# Patient Record
Sex: Male | Born: 1950 | Hispanic: Yes | Smoking: Never smoker
Health system: Southern US, Community
[De-identification: ages and names within clinical notes are randomized; demographics above are authoritative.]

## PROBLEM LIST (undated history)

## (undated) ENCOUNTER — Ambulatory Visit (HOSPITAL_COMMUNITY): Admission: EM | Payer: BC Managed Care – PPO

## (undated) DIAGNOSIS — J45909 Unspecified asthma, uncomplicated: Secondary | ICD-10-CM

## (undated) DIAGNOSIS — J309 Allergic rhinitis, unspecified: Secondary | ICD-10-CM

## (undated) HISTORY — DX: Allergic rhinitis, unspecified: J30.9

## (undated) HISTORY — DX: Unspecified asthma, uncomplicated: J45.909

## (undated) HISTORY — PX: INGUINAL HERNIA REPAIR: SUR1180

---

## 2014-01-07 ENCOUNTER — Other Ambulatory Visit: Payer: Self-pay | Admitting: Infectious Disease

## 2014-01-07 ENCOUNTER — Ambulatory Visit
Admission: RE | Admit: 2014-01-07 | Discharge: 2014-01-07 | Disposition: A | Discharge status: Home / Self Care | Payer: No Typology Code available for payment source | Source: Ambulatory Visit | Attending: Infectious Disease | Admitting: Infectious Disease

## 2014-01-07 DIAGNOSIS — R7611 Nonspecific reaction to tuberculin skin test without active tuberculosis: Secondary | ICD-10-CM

## 2014-08-14 ENCOUNTER — Ambulatory Visit: Payer: Self-pay | Attending: Internal Medicine

## 2015-06-02 ENCOUNTER — Ambulatory Visit: Payer: Self-pay | Admitting: Family Medicine

## 2015-07-22 ENCOUNTER — Ambulatory Visit: Payer: Self-pay | Admitting: Family Medicine

## 2015-08-07 ENCOUNTER — Encounter: Payer: Self-pay | Admitting: Family Medicine

## 2015-08-07 ENCOUNTER — Ambulatory Visit (INDEPENDENT_AMBULATORY_CARE_PROVIDER_SITE_OTHER): Payer: BLUE CROSS/BLUE SHIELD | Admitting: Family Medicine

## 2015-08-07 VITALS — BP 142/88 | HR 68 | Temp 98.0°F | Ht 65.0 in | Wt 158.8 lb

## 2015-08-07 DIAGNOSIS — R7611 Nonspecific reaction to tuberculin skin test without active tuberculosis: Secondary | ICD-10-CM

## 2015-08-07 DIAGNOSIS — M25522 Pain in left elbow: Secondary | ICD-10-CM | POA: Diagnosis not present

## 2015-08-07 DIAGNOSIS — B9789 Other viral agents as the cause of diseases classified elsewhere: Principal | ICD-10-CM

## 2015-08-07 DIAGNOSIS — J069 Acute upper respiratory infection, unspecified: Secondary | ICD-10-CM | POA: Diagnosis not present

## 2015-08-07 NOTE — Assessment & Plan Note (Addendum)
Anticipate viral given short duration and no signs of bacterial infection today. See pt instructions for supportive care - ibuprofen and delsym prn, and given h/o asthma will refill albuterol inhaler. Update if not improving as expected.

## 2015-08-07 NOTE — Assessment & Plan Note (Addendum)
Not quite consistent with tennis elbow or tendonitis - but will regardless treat as such with NSAID and exercises from SM pt advisor. Take ibuprofen 400mg  bid x 5d with meals then prn

## 2015-08-07 NOTE — Progress Notes (Signed)
Pre visit review using our clinic review tool, if applicable. No additional management support is needed unless otherwise documented below in the visit note. 

## 2015-08-07 NOTE — Assessment & Plan Note (Signed)
Update PPD next physical.

## 2015-08-07 NOTE — Patient Instructions (Addendum)
Call your insurance about the shingles shot to see if it is covered or how much it would cost and where is cheaper (here or pharmacy).  If you want to receive here, call for nurse visit.   Para posible tendonitis del codo izquierdo - empieze ibuprofeno 400mg  dos veces al dia con comida x5 dias y luego solo cuando necesite. Haga ejercicios recetados hoy. Creo tambien que tiene infeccion viral respiratoria - debe mejorar con Museum/gallery conservatorel tiempo. Tome delsym y albuterol como necesite.   Proxima visita - fisico con laboratorios en los proximos 2 meses.  Gusto conocerlo hoy, llamenos con preguntas.

## 2015-08-07 NOTE — Progress Notes (Addendum)
BP 142/88 mmHg  Pulse 68  Temp(Src) 98 F (36.7 C) (Oral)  Ht  (1.651 m)  Wt 158 lb 12 oz (72.009 kg)  BMI 26.42 kg/m2   CC: new pt to establish  Subjective:    Patient ID: Melvin Castillo, male    DOB: 07-Jun-1950, 65 y.o.   MRN: [id number]  HPI: Melvin Castillo is a 65 y.o. 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  Wife passed away from pancreatic cancer 05/2015.  Has supportive family in area and supportive church family.  Lactose intolerance.   Ongoing URI 2 wks - started with fever, ST, now with rhinorrhea, mildly productive cough. Some sinus and chest congestion. Self treating with loratadine and proair inhaler. Has been told has propensity to asthma. No sick contacts at home.   L elbow pain ongoing for last few months. Points to lateral L elbow. affecting left hand grip strength. Slowly improving. Denies inciting trauma/injury.  Takes several usana supplements - glucosamine.   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 Will recheck next visit. Declines unexplained night sweats or prolonged cough. Denies known exposure.   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 Interim medical history since our last visit reviewed. Allergies and medications reviewed and updated. No current outpatient prescriptions on file prior to visit.   No current facility-administered medications on file prior to visit.    Review of Systems Per HPI unless specifically indicated in ROS section     Objective:    BP 142/88 mmHg  Pulse 68  Temp(Src) 98 F (36.7  C) (Oral)  Ht  (1.651 m)  Wt 158 lb 12 oz (72.009 kg)  BMI 26.42 kg/m2  Wt Readings from Last 3 Encounters:  08/07/15 158 lb 12 oz (72.009 kg)    Physical Exam  Constitutional: He appears well-developed and well-nourished. No distress.  HENT:  Head: Normocephalic and atraumatic.  Right Ear: Hearing, tympanic membrane, external ear and ear canal normal.  Left Ear: Hearing, tympanic membrane, external ear and ear canal normal.  Nose: Nose normal. No mucosal edema or rhinorrhea. Right sinus exhibits no maxillary sinus tenderness and no frontal sinus tenderness. Left sinus exhibits no maxillary sinus tenderness and no frontal sinus tenderness.  Mouth/Throat: Uvula is midline, oropharynx is clear and moist and mucous membranes are normal. No oropharyngeal exudate, posterior oropharyngeal edema, posterior oropharyngeal erythema or tonsillar abscesses.  Eyes: Conjunctivae and EOM are normal. Pupils are equal, round, and reactive to light. No scleral icterus.  Neck: Normal range of motion. Neck supple.  Small LN R posterior cervical chain  Cardiovascular: Normal rate, regular rhythm, normal heart sounds and intact distal pulses.   No murmur heard. Pulmonary/Chest: Effort normal and breath sounds normal. No respiratory distress. He has no wheezes. He has no rales.  Musculoskeletal: He exhibits no edema.  FROM at elbow Tender to palpation just proximal to lateral epicondyle.  Lymphadenopathy:    He has no cervical adenopathy.  Skin: Skin is warm and dry. No rash noted.  Nursing note and vitals reviewed.  No results found for this or any previous  visit.    Assessment & Plan:   Problem List Items Addressed This Visit    Viral URI with cough - Primary    Anticipate viral given short duration and no signs of bacterial infection today. See pt instructions for supportive care - ibuprofen and delsym prn, and given h/o asthma will refill albuterol inhaler. Update if not improving as  expected.      Positive PPD    Update PPD next physical.      Left elbow pain    Not quite consistent with tennis elbow or tendonitis - but will regardless treat as such with NSAID and exercises from SM pt advisor. Take ibuprofen 400mg  bid x 5d with meals then prn          Follow up plan: Return in about 2 months (around 10/07/2015) for annual exam, prior fasting for blood work.

## 2015-09-13 ENCOUNTER — Encounter: Payer: Self-pay | Admitting: Family Medicine

## 2015-09-28 ENCOUNTER — Other Ambulatory Visit: Payer: Self-pay | Admitting: Family Medicine

## 2015-09-28 DIAGNOSIS — R7611 Nonspecific reaction to tuberculin skin test without active tuberculosis: Secondary | ICD-10-CM

## 2015-09-28 DIAGNOSIS — Z125 Encounter for screening for malignant neoplasm of prostate: Secondary | ICD-10-CM

## 2015-09-28 DIAGNOSIS — Z806 Family history of leukemia: Secondary | ICD-10-CM

## 2015-09-28 DIAGNOSIS — Z131 Encounter for screening for diabetes mellitus: Secondary | ICD-10-CM

## 2015-09-28 DIAGNOSIS — Z13220 Encounter for screening for lipoid disorders: Secondary | ICD-10-CM

## 2015-09-28 DIAGNOSIS — Z1159 Encounter for screening for other viral diseases: Secondary | ICD-10-CM

## 2015-10-02 ENCOUNTER — Other Ambulatory Visit: Payer: Self-pay | Admitting: Family Medicine

## 2015-10-02 ENCOUNTER — Ambulatory Visit (INDEPENDENT_AMBULATORY_CARE_PROVIDER_SITE_OTHER)
Admission: RE | Admit: 2015-10-02 | Discharge: 2015-10-02 | Disposition: A | Discharge status: Home / Self Care | Payer: BLUE CROSS/BLUE SHIELD | Source: Ambulatory Visit | Attending: Family Medicine | Admitting: Family Medicine

## 2015-10-02 ENCOUNTER — Other Ambulatory Visit (INDEPENDENT_AMBULATORY_CARE_PROVIDER_SITE_OTHER): Payer: BLUE CROSS/BLUE SHIELD

## 2015-10-02 DIAGNOSIS — Z806 Family history of leukemia: Secondary | ICD-10-CM

## 2015-10-02 DIAGNOSIS — Z131 Encounter for screening for diabetes mellitus: Secondary | ICD-10-CM

## 2015-10-02 DIAGNOSIS — Z125 Encounter for screening for malignant neoplasm of prostate: Secondary | ICD-10-CM

## 2015-10-02 DIAGNOSIS — R7611 Nonspecific reaction to tuberculin skin test without active tuberculosis: Secondary | ICD-10-CM

## 2015-10-02 DIAGNOSIS — Z13220 Encounter for screening for lipoid disorders: Secondary | ICD-10-CM | POA: Diagnosis not present

## 2015-10-02 DIAGNOSIS — Z1159 Encounter for screening for other viral diseases: Secondary | ICD-10-CM

## 2015-10-02 LAB — PSA: PSA: 1.76 ng/mL (ref 0.10–4.00)

## 2015-10-02 LAB — CBC WITH DIFFERENTIAL/PLATELET
BASOS ABS: 0 10*3/uL (ref 0.0–0.1)
Basophils Relative: 0.4 % (ref 0.0–3.0)
EOS ABS: 0.2 10*3/uL (ref 0.0–0.7)
Eosinophils Relative: 3.9 % (ref 0.0–5.0)
HEMATOCRIT: 43.1 % (ref 39.0–52.0)
HEMOGLOBIN: 14.5 g/dL (ref 13.0–17.0)
LYMPHS ABS: 1.9 10*3/uL (ref 0.7–4.0)
LYMPHS PCT: 40 % (ref 12.0–46.0)
MCHC: 33.7 g/dL (ref 30.0–36.0)
MCV: 87.8 fl (ref 78.0–100.0)
MONOS PCT: 8.3 % (ref 3.0–12.0)
Monocytes Absolute: 0.4 10*3/uL (ref 0.1–1.0)
Neutro Abs: 2.3 10*3/uL (ref 1.4–7.7)
Neutrophils Relative %: 47.4 % (ref 43.0–77.0)
Platelets: 184 10*3/uL (ref 150.0–400.0)
RBC: 4.92 Mil/uL (ref 4.22–5.81)
RDW: 14.9 % (ref 11.5–15.5)
WBC: 4.8 10*3/uL (ref 4.0–10.5)

## 2015-10-02 LAB — LIPID PANEL
CHOL/HDL RATIO: 3
Cholesterol: 142 mg/dL (ref 0–200)
HDL: 41.6 mg/dL (ref >39.00)
LDL CALC: 86 mg/dL (ref 0–99)
NONHDL: 100.48
Triglycerides: 74 mg/dL (ref 0.0–149.0)
VLDL: 14.8 mg/dL (ref 0.0–40.0)

## 2015-10-02 LAB — BASIC METABOLIC PANEL
BUN: 14 mg/dL (ref 6–23)
CO2: 27 mEq/L (ref 19–32)
Calcium: 9.3 mg/dL (ref 8.4–10.5)
Chloride: 104 mEq/L (ref 96–112)
Creatinine, Ser: 0.98 mg/dL (ref 0.40–1.50)
GFR: 81.96 mL/min (ref >60.00)
Glucose, Bld: 96 mg/dL (ref 70–99)
POTASSIUM: 4.5 meq/L (ref 3.5–5.1)
SODIUM: 139 meq/L (ref 135–145)

## 2015-10-03 LAB — HEPATITIS C ANTIBODY: HCV Ab: NEGATIVE

## 2015-10-09 ENCOUNTER — Encounter: Payer: BLUE CROSS/BLUE SHIELD | Admitting: Family Medicine

## 2015-10-27 ENCOUNTER — Telehealth: Payer: Self-pay | Admitting: Family Medicine

## 2015-10-27 ENCOUNTER — Encounter: Payer: BLUE CROSS/BLUE SHIELD | Admitting: Family Medicine

## 2015-10-27 DIAGNOSIS — Z0289 Encounter for other administrative examinations: Secondary | ICD-10-CM

## 2015-10-27 NOTE — Telephone Encounter (Signed)
Patient did not come for their scheduled appointment today for cpx  Please let me know if the patient needs to be contacted immediately for follow up or if no follow up is necessary.   ° °

## 2015-10-28 NOTE — Telephone Encounter (Signed)
Pt r/s 7/17

## 2015-10-30 ENCOUNTER — Encounter: Payer: BLUE CROSS/BLUE SHIELD | Admitting: Family Medicine

## 2015-12-08 ENCOUNTER — Encounter: Payer: Self-pay | Admitting: Family Medicine

## 2015-12-08 ENCOUNTER — Ambulatory Visit (INDEPENDENT_AMBULATORY_CARE_PROVIDER_SITE_OTHER): Payer: BC Managed Care – PPO | Admitting: Family Medicine

## 2015-12-08 VITALS — BP 116/68 | HR 62 | Temp 98.0°F | Resp 16 | Ht 65.0 in | Wt 159.0 lb

## 2015-12-08 DIAGNOSIS — R7611 Nonspecific reaction to tuberculin skin test without active tuberculosis: Secondary | ICD-10-CM | POA: Diagnosis not present

## 2015-12-08 DIAGNOSIS — Z1211 Encounter for screening for malignant neoplasm of colon: Secondary | ICD-10-CM

## 2015-12-08 DIAGNOSIS — Z0000 Encounter for general adult medical examination without abnormal findings: Secondary | ICD-10-CM

## 2015-12-08 MED ORDER — ALBUTEROL SULFATE HFA 108 (90 BASE) MCG/ACT IN AERS: 2.0000 | INHALATION_SPRAY | Freq: Four times a day (QID) | RESPIRATORY_TRACT | Status: DC | PRN

## 2015-12-08 NOTE — Progress Notes (Signed)
BP 116/68 mmHg  Pulse 62  Temp(Src) 98 F (36.7 C) (Oral)  Resp 16  Ht  (1.651 m)  Wt 159 lb (72.122 kg)  BMI 26.46 kg/m2  SpO2 98%   CC: CPE  Subjective:    Patient ID: Melvin Castillo, male    DOB: July 23, 1950, 65 y.o.   MRN: [id number]  HPI: Melvin Castillo is a 65 y.o. male presenting on 12/08/2015 for Annual Exam and Neck Pain   CPE today. Wife recently passed away from pancreatic cancer. Supportive family and church.   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 Never completed TB treatment. No h/o TB. Declines unexplained night sweats or prolonged cough. Denies known exposure.   2d h/o R neck pain with radiation to upper back/shoulder. Started when fellow church member started massaging him too hard on back. Today improving on its own. Treated with ibuprofen.   Also with L foot pain - provided with stretching exercises for plantar fasciitis.   Takes usana supplement.   Preventative: Colonoscopy - per pt ~10 yrs ago WNL. Requests referral for colonoscopy.  Prostate cancer - discussed, would like to screen.  Declines flu shot  Tetanus 2014  Pneumovax 2014  zostavax - will check with insurance  Seat belt use discussed No changing moles on skin.   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 Interim medical history since our last visit reviewed. Allergies and medications reviewed and updated. Current Outpatient Prescriptions on File Prior to Visit  Medication Sig  . NON FORMULARY Usana supplements   No current facility-administered medications on file prior to visit.    Review of Systems  Constitutional: Negative for fever, chills, activity change, appetite change, fatigue and unexpected weight change.  HENT: Negative for  hearing loss.   Eyes: Negative for visual disturbance.  Respiratory: Negative for cough, chest tightness, shortness of breath and wheezing.   Cardiovascular: Negative for chest pain, palpitations and leg swelling.  Gastrointestinal: Positive for blood in stool (with wiping). Negative for nausea, vomiting, abdominal pain, diarrhea, constipation and abdominal distention.       H/o hemorrhoids - uses prep H  Genitourinary: Negative for hematuria and difficulty urinating.  Musculoskeletal: Negative for myalgias, arthralgias and neck pain.  Skin: Negative for rash.  Neurological: Negative for dizziness, seizures, syncope and headaches.  Hematological: Negative for adenopathy. Does not bruise/bleed easily.  Psychiatric/Behavioral: Negative for dysphoric mood. The patient is not nervous/anxious.    Per HPI unless specifically indicated in ROS section     Objective:    BP 116/68 mmHg  Pulse 62  Temp(Src) 98 F (36.7 C) (Oral)  Resp 16  Ht  (1.651 m)  Wt 159 lb (72.122 kg)  BMI 26.46 kg/m2  SpO2 98%  Wt Readings from Last 3 Encounters:  12/08/15 159 lb (72.122 kg)  08/07/15 158 lb 12 oz (72.009 kg)    Physical Exam  Constitutional: He is oriented to person, place, and time. He appears well-developed and well-nourished. No distress.  HENT:  Head: Normocephalic and atraumatic.  Right Ear: Hearing, tympanic membrane, external ear and ear canal normal.  Left Ear: Hearing, tympanic membrane, external ear and ear canal normal.  Nose: Nose normal.  Mouth/Throat: Uvula is midline, oropharynx is clear and moist and mucous membranes  are normal. No oropharyngeal exudate, posterior oropharyngeal edema or posterior oropharyngeal erythema.  Eyes: Conjunctivae and EOM are normal. Pupils are equal, round, and reactive to light. No scleral icterus.  Neck: Normal range of motion. Neck supple. Carotid bruit is not present. No thyromegaly present.  Cardiovascular: Normal rate, regular rhythm, normal  heart sounds and intact distal pulses.   No murmur heard. Pulses:      Radial pulses are 2+ on the right side, and 2+ on the left side.  Pulmonary/Chest: Effort normal and breath sounds normal. No respiratory distress. He has no wheezes. He has no rales.  Abdominal: Soft. Bowel sounds are normal. He exhibits no distension and no mass. There is tenderness. There is no rebound and no guarding.  Genitourinary: Rectum normal and prostate normal. Rectal exam shows no external hemorrhoid, no fissure, no mass, no tenderness and anal tone normal. Prostate is not enlarged (15gm) and not tender.  Musculoskeletal: Normal range of motion. He exhibits no edema.  Lymphadenopathy:    He has no cervical adenopathy.  Neurological: He is alert and oriented to person, place, and time.  CN grossly intact, station and gait intact  Skin: Skin is warm and dry. No rash noted.  Psychiatric: He has a normal mood and affect. His behavior is normal. Judgment and thought content normal.  Nursing note and vitals reviewed.  Results for orders placed or performed in visit on 10/02/15  Lipid panel  Result Value Ref Range   Cholesterol 142 0 - 200 mg/dL   Triglycerides 16.174.0 0.0 - 149.0 mg/dL   HDL 09.6041.60 >45.40>39.00 mg/dL   VLDL 98.114.8 0.0 - 19.140.0 mg/dL   LDL Cholesterol 86 0 - 99 mg/dL   Total CHOL/HDL Ratio 3    NonHDL 100.48   Basic metabolic panel  Result Value Ref Range   Sodium 139 135 - 145 mEq/L   Potassium 4.5 3.5 - 5.1 mEq/L   Chloride 104 96 - 112 mEq/L   CO2 27 19 - 32 mEq/L   Glucose, Bld 96 70 - 99 mg/dL   BUN 14 6 - 23 mg/dL   Creatinine, Ser 4.780.98 0.40 - 1.50 mg/dL   Calcium 9.3 8.4 - 29.510.5 mg/dL   GFR 62.1381.96 >08.65>60.00 mL/min  PSA  Result Value Ref Range   PSA 1.76 0.10 - 4.00 ng/mL  CBC with Differential/Platelet  Result Value Ref Range   WBC 4.8 4.0 - 10.5 K/uL   RBC 4.92 4.22 - 5.81 Mil/uL   Hemoglobin 14.5 13.0 - 17.0 g/dL   HCT 78.443.1 69.639.0 - 29.552.0 %   MCV 87.8 78.0 - 100.0 fl   MCHC 33.7 30.0 - 36.0  g/dL   RDW 28.414.9 13.211.5 - 44.015.5 %   Platelets 184.0 150.0 - 400.0 K/uL   Neutrophils Relative % 47.4 43.0 - 77.0 %   Lymphocytes Relative 40.0 12.0 - 46.0 %   Monocytes Relative 8.3 3.0 - 12.0 %   Eosinophils Relative 3.9 0.0 - 5.0 %   Basophils Relative 0.4 0.0 - 3.0 %   Neutro Abs 2.3 1.4 - 7.7 K/uL   Lymphs Abs 1.9 0.7 - 4.0 K/uL   Monocytes Absolute 0.4 0.1 - 1.0 K/uL   Eosinophils Absolute 0.2 0.0 - 0.7 K/uL   Basophils Absolute 0.0 0.0 - 0.1 K/uL      Assessment & Plan:   Problem List Items Addressed This Visit    Health maintenance examination - Primary    Preventative protocols reviewed and updated unless pt declined. Discussed  healthy diet and lifestyle.       Positive PPD    Normal CXR 12/2013, 09/2015. Never completed treatment. Check quantiferon gold. If positive, will need TB treatment.       Relevant Orders   Quantiferon tb gold assay    Other Visit Diagnoses    Special screening for malignant neoplasms, colon        Relevant Orders    Ambulatory referral to Gastroenterology        Follow up plan: Return in about 1 year (around 12/07/2016), or as needed, for annual exam, prior fasting for blood work.  Eustaquio Boyden, MD

## 2015-12-08 NOTE — Assessment & Plan Note (Signed)
Preventative protocols reviewed and updated unless pt declined. Discussed healthy diet and lifestyle.  

## 2015-12-08 NOTE — Assessment & Plan Note (Addendum)
Normal CXR 12/2013, 09/2015. Never completed treatment. Check quantiferon gold. If positive, will need TB treatment.

## 2015-12-08 NOTE — Patient Instructions (Addendum)
Call your insurance about the shingles shot to see if it is covered or how much it would cost and where is cheaper (here or pharmacy).  If you want to receive here, call for nurse visit.  Lo vamos a remitir para colonoscopia con doctores [place].  Revisaremos preuba de sangre para ver riesgos de turbeculosis.  Regresar en 1 ao para proximo examen fisico, antes como necesite.   Mantenimiento de Technical sales engineer - Hombres (Health Maintenance, Male) Un estilo de vida saludable y los cuidados preventivos pueden favorecer la salud y [place].  No deje de [employer] de rutina de la salud, dentales y de Public librarian.  Consuma una dieta saludable. Los [employer], frutas, cereales integrales, productos lcteos descremados y protenas magras contienen los nutrientes que usted necesita y no tienen muchas caloras. Disminuya la ingesta de alimentos ricos en grasas slidas, azcares y sal agregadas. Si es necesario, pdale informacin acerca de una dieta [place] a su mdico.  Realizar actividad fsica de forma regular es una de las prcticas ms importantes que puede hacer por su salud. Los adultos deben hacer al menos 150 minutos de ejercicios de intensidad moderada (cualquier actividad que aumente la frecuencia cardaca y lo haga transpirar) cada semana. Adems, la [employer] de los adultos necesita practicar ejercicios de fortalecimiento muscular dos o ms veces por semana.  Mantenga un peso saludable. El ndice de masa corporal [facility]) es una herramienta que identifica posibles problemas con [place]. Proporciona una estimacin de la grasa corporal basndose en el peso y la altura. El mdico podr determinar su [facility] y ayudarlo a Scientist, forensic o Theatre manager un peso saludable. Para los adultos mayores de 20aos:  Un [facility] menor de 18,5 se considera bajo peso.  Un [facility] entre 18,5 y 24,9 es normal.  Un [facility] entre 25 y 29,9 se considera sobrepeso.  Un IMC de 30 o ms se considera obesidad.  Mantenga un  nivel normal de lpidos y colesterol en la sangre practicando actividad fsica y minimizando la ingesta de grasas saturadas. Consuma una dieta balanceada e incluya variedad de frutas y vegetales. A partir de los 20 aos se deben realizar anlisis de sangre a fin de Freight forwarder nivel de lpidos y colesterol en la sangre y [place] cada 5 aos. Si los niveles de colesterol son altos, tiene ms de 50 aos o tiene riesgo elevado de sufrir enfermedades cardacas, Designer, industrial/product controlarse con ms frecuencia. Si tiene [employer] de lpidos y colesterol, debe recibir tratamiento con medicamentos, si la dieta y el ejercicio no estn funcionando.  Si fuma, consulte con el mdico acerca de las opciones para dejar de [place]. Si no fuma, no comience.  Se recomienda realizar exmenes de deteccin de cncer de pulmn a personas adultas entre 45 y 6 aos que estn en riesgo de Horticulturist, commercial de pulmn por sus antecedentes de consumo de tabaco. Para quienes hayan fumado durante 30 aos un paquete diario, y sigan fumando o hayan dejado el hbito en algn momento en los ltimos 15 aos, se recomienda realizarse una tomografa computada de baja dosis de los pulmones todos los [place]. Fumar un paquete por ao equivale a fumar un promedio de un paquete de cigarrillos diario durante un ao (por ejemplo, fumar 30paquetes por ao podra significar fumar un paquete de cigarrillos diario durante 30aos o 2paquetes diarios durante 15aos). Los exmenes anuales deben continuar hasta que el fumador haya dejado de fumar durante un mnimo de 15 aos. Ya no deben Optometrist  tengan un problema de salud que les impida recibir tratamiento para Science writer de pulmn.  Si decide tomar alcohol, no beba ms de [employer]. Se considera una medida 12onzas (324m) de cerveza, 5onzas (1553m de vino o 1,6,[id number] licor.  Evite el consumo de drogas. No comparta agujas. Pida ayuda si necesita asistencia  o instrucciones con respecto a abandonar el consumo de drogas.  La hipertensin arterial causa enfermedades cardacas y auSerbial riesgo de ictus. La hipertensin arterial es ms probable en los siguientes casos:  Las personas que tienen la presin arterial en el extremo del rango normal (100-139/85-89 mm Hg).  Las personas con sobrepeso u obesidad.  Las peRetail banker Si usted tiene entre 18 y 39 aos, debe medirse la presin arterial cada 3 a 5 aos. Si usted tiene 40 aos o ms, debe medirse la presin arterial toHewlett-PackardDebe medirse la presin arterial dos veces: una vez cuando est en un hospital o una clnica y la otra vez cuando est en otro sitio. Registre el promedio de laFederated Department StoresPara controlar su presin arterial cuando no est en un hospital o unGrace Isaacpuede usar lo siguiente:  UnArdelia Memsquina automtica para medir la presin arterial en una farmacia.  Un monitor para medir la presin arterial en el hogar.  Si tiene entre [id number], consulte a su mdico si debe tomar aspirina para prevenir enfermedades cardacas.  Los anlisis para la diabetes incluyen la toma de unTanzaniae sangre para controlar el nivel de azcar en la sangre durante el aySylvesterDebe hacerlos cada 3aos despus de los 457aosi su peso es normal y no tiene factores de riesgo de diabetes. Las pruebas deben comenzar a edades tempranas o llevarse a cabo con ms frecuencia si tiene sobrepeso y al menos un factor de riesgo para la diabetes.  El cncer colorrectal puede detectarse y con frecuencia puede prevenirse. La mayor parte de los estudios de rutina se deben coMedical laboratory scientific officer haField seismologist paProofreadere los 5030os y haPioneer540os. Sin embargo, el mdico podr aconsejarle que lo haga antes, si tiene factores de riesgo para el cncer de colon. Una vez por ao, el mdico le dar un kit de prueba para haHydrologistn la materia fecal. Es posible que se use una pequea cmara en el extremo de  un tubo para examinar directamente el colon (sigmoidoscopa o colonoscopa) para deHydrographic surveyorormas tempranas de cncer colorrectal. Hable sobre esto con su mdico si tiene [id number], edad a la que coWhole Foodsstudios de ruNepalEl examen directo del colon debe repetirse cada cinco a 10 aos, hasta los 75 aos, excepto que se encuentren formas tempranas de plipos precancerosos o pequeos bultos.  Las personas con un riesgo mayor de paInsurance risk surveyorepatitis B deben realizarse anlisis para deFutures traderirus. Se considera que tiene un alto riesgo de coMuseum/gallery curatorepatitis B si:  Naci en un pas donde la hepatitis B es frecuente. Pregntele a su mdico qu pases son considerados de alPublic affairs consultant Sus padres nacieron en un pas de alto riesgo y usted no recibi una vacuna que lo proteja contra la hepatitis B (vacuna contra la hepatitis B).  TiBroomtown UsCanadagujas para inyectarse drogas.  Vive o tiene sexo con alguien que tiene hepatitis B.  Es un hombre que tiene sexo con otros hombres.  Recibe tratamiento de hemodilisis.  Toma ciertos medicamentos para enNurse, mental health  trasplante de rganos y afecciones autoinmunes.  Se recomienda realizar un anlisis de sangre para Hydrographic surveyor hepatitis C a todas las personas nacidas entre 1945 y 1965, y a toda persona que tenga un riesgo de haber contrado esta enfermedad.  Los hombres sanos no deben hacerse anlisis de sangre para Hydrographic surveyor antgenos especficos prostticos (PSA) como parte de los estudios de rutina para Science writer. Pregntele a su mdico sobre las pruebas de deteccin de cncer de prstata.  La evaluacin del cncer de testculos no se recomienda en hombres adolescentes ni adultos que no tengan sntomas. La evaluacin incluye el autoexamen, el examen por parte del mdico y otras pruebas diagnsticas. Consulte con su mdico si tiene algn sntoma o preocupaciones acerca del cncer de testculos.  Practique el sexo seguro. Use condones y  evite las prcticas sexuales riesgosas para disminuir el contagio de enfermedades de transmisin sexual (ETS).  Debe realizarse pruebas de deteccin de ETS, incluidas la gonorrea y la clamidia si:  Es sexualmente activa y es menor de [place].  Es mayor de 58aos y Investment banker, operational dice que est en riesgo de padecer esta infeccin.  La actividad sexual ha cambiado desde que le hicieron la ltima prueba de deteccin y tiene un riesgo mayor de Best boy clamidia o Radio broadcast assistant. Pregntele al mdico si usted tiene riesgo.  Si tiene riesgo de infectarse por el VIH, se recomienda tomar diariamente un medicamento recetado para evitar la infeccin. Esto se conoce como profilaxis previa a la exposicin. Se considera que est en riesgo si:  Es un hombre que tiene sexo con otros hombres.  Es heterosexual y es activo sexualmente con mltiples parejas.  Se inyecta drogas.  Es sexualmente activo con una pareja que tiene VIH.  Consulte a su mdico para saber si tiene un alto riesgo de infectarse por el VIH. Si opta por comenzar la profilaxis previa a la exposicin, primero debe realizarse anlisis de deteccin del VIH. Luego, le harn anlisis cada 5mses mientras est tomando los medicamentos para la profilaxis previa a la exposicin.  Utilice pantalla solar. Aplique pantalla solar de mKerry Doryy repetida a lo largo del dTraining and development officer Resgurdese del sol cuando la sombra sea ms pequea que usted. Protjase usando mangas y pThe ServiceMaster Company un sombrero de ala ancha y gafas para el sol todo el ao, siempre que se encuentre en el exterior.  Informe a su mdico si aparecen nuevos lunares o los que tiene se modifican, especialmente en forma y color. Tambin notifique al mdico si un lunar es ms grande que el tamao de una goma de lGames developer  Si tiene entre [id number] 712aos y es o ha sido fumador, se recomienda un estudio con ecografa para dEnvironmental managerde aorta abdominal (AAA) y su eventual reparacin  q[place]  MStickney(inmunizaciones).   Esta informacin no tiene cMarine scientistel consejo del mdico. Asegrese de hacerle al mdico cualquier pregunta que tenga.   Document Released: 11/06/2007 Document Revised: 05/31/2014 Elsevier Interactive Patient Education 2[employer]

## 2015-12-10 LAB — QUANTIFERON TB GOLD ASSAY (BLOOD)
Interferon Gamma Release Assay: POSITIVE — AB
Mitogen-Nil: >10 IU/mL
QUANTIFERON TB AG MINUS NIL: 7.89 [IU]/mL
Quantiferon Nil Value: 0.18 IU/mL

## 2015-12-23 ENCOUNTER — Encounter: Payer: Self-pay | Admitting: Family Medicine

## 2015-12-23 ENCOUNTER — Ambulatory Visit (INDEPENDENT_AMBULATORY_CARE_PROVIDER_SITE_OTHER): Payer: BC Managed Care – PPO | Admitting: Family Medicine

## 2015-12-23 VITALS — BP 118/82 | HR 56 | Temp 98.2°F | Wt 157.5 lb

## 2015-12-23 DIAGNOSIS — R7611 Nonspecific reaction to tuberculin skin test without active tuberculosis: Secondary | ICD-10-CM

## 2015-12-23 LAB — HEPATIC FUNCTION PANEL
ALK PHOS: 44 U/L (ref 39–117)
ALT: 23 U/L (ref 0–53)
AST: 20 U/L (ref 0–37)
Albumin: 4.3 g/dL (ref 3.5–5.2)
BILIRUBIN TOTAL: 0.7 mg/dL (ref 0.2–1.2)
Bilirubin, Direct: 0.1 mg/dL (ref 0.0–0.3)
Total Protein: 7.3 g/dL (ref 6.0–8.3)

## 2015-12-23 MED ORDER — RIFAMPIN 300 MG PO CAPS: 600.0000 mg | ORAL_CAPSULE | Freq: Every day | ORAL | 3 refills | Status: DC

## 2015-12-23 NOTE — Progress Notes (Signed)
Pre visit review using our clinic review tool, if applicable. No additional management support is needed unless otherwise documented below in the visit note. 

## 2015-12-23 NOTE — Patient Instructions (Addendum)
Revise el precio para rifampin - seria tomar  diarios (2 tabletas) por 4 meses.  Sangre hoy para revisar higado. Regresar en 2 mess para laboratorio para revisar el higado.   Tuberculosis (Tuberculosis) La tuberculosis (TB) es una infeccin bacteriana que produce daos en los tejidos del cuerpo. Por lo general, afecta los pulmones, pero puede afectar otras partes del cuerpo. La TB tiene dos etapas:   TB activa. En esta etapa, la persona presenta los sntomas de TB y puede transmitir la enfermedad fcilmente (es contagiosa).  TB latente. En esta etapa, la persona no tiene ningn sntoma de TB y no contagia la enfermedad. Es importante recibir tratamiento ms all del tipo de TB, porque la TB latente puede convertirse en TB [place].  CAUSAS  La causa de la TB es una bacteria llamada Mycobacterium tuberculosis. Puede ocurrir una infeccin cuando Neomia Dear persona con TB activa tose o estornuda y Nature conservation officer la bacteria en el aire. Otra persona puede aspirar la bacteria, la que llega a los pulmones y causa una infeccin.  FACTORES DE RIESGO Entre los factores de riesgo se incluyen los siguientes:  VIH/sida.  Diabetes.  La edad. Los bebs y las personas de edad avanzada tienen ms probabilidades de contraer TB.  El consumo de drogas.  El consumo de tabaco.  Vivir en o viajar a lugares en donde las tasas de TB son altas, por ejemplo:  [place] subs[place].  [place].  [place].  [place].  [place].  El hecho de vivir o de Printmaker en reas superpobladas o con mala ventilacin, lo que incluye:  Establecimientos de Psychologist, prison and probation services.  Establecimientos de atencin residencial.  Campos de refugiados o refugios para personas sin hogar. SIGNOS Y SNTOMAS   Tos que dura tres semanas o ms.  Dolor en el pecho o dolor al respirar o toser.  Prdida de peso sin causa aparente.  Fatiga y debilidad.  Grant Ruts.  Sudoracin.  Escalofros.  Prdida del apetito.  Tos con sangre o flema (esputo),  o ambas cosas. DIAGNSTICO  El diagnstico de TB puede incluir lo siguiente:  Un examen fsico y Neomia Dear historia clnica.  Una prueba cutnea.  Anlisis de [place].  Radiografa de trax.  Un estudio del esputo.  Anlisis de [place]. TRATAMIENTO  La TB se trata con antibiticos, que deben tomarse de 6a .  INSTRUCCIONES PARA EL CUIDADO EN EL HOGAR   Tome los antibiticos como le indic el mdico. Termine los antibiticos aunque comience a sentirse mejor.  Concurra a todas las visitas de control como se lo haya indicado el mdico. Esto es importante.  Informe al mdico sobre todas las personas que viven con usted o con las que tiene contacto cercano. Su mdico o el Departamento de Salud puede hablar con estas personas para que se hagan estudios de TB.  Descanse todo lo que sea necesario.  No consuma ningn producto que contenga tabaco, lo que incluye cigarrillos, tabaco de Theatre manager o Administrator, Civil Service. Si necesita ayuda para dejar de fumar, consulte al [employer].  Evite el contacto cercano con los dems, especialmente con los bebs y las personas de edad avanzada, Teacher, adult education que el mdico le diga que ya no contagiar la TB.  Al toser o estornudar, cbrase la boca y la [place]. Deseche los pauelos descartables usados como se lo haya indicado el mdico.  Lave sus manos frecuentemente con agua y [place].  No regrese al Aleen Campi ni al mbito de estudio hasta que el mdico lo autorice. SOLICITE ATENCIN MDICA SI:   Aparecen nuevos sntomas.  Pierde el apetito.  Siente nuseas o vomita.  Orina de color amarillo oscuro.  La piel o la parte blanca de los ojos se torna de Educational psychologist.  Los sntomas persisten o empeoran.  Tiene fiebre. SOLICITE ATENCIN MDICA DE INMEDIATO SI:   Siente dolor en el pecho.  Tose y escupe sangre.  Siente dificultad para respirar o Company secretary.  Siente dolor de [place] o rigidez en el cuello. ASEGRESE DE QUE:  Comprende estas  instrucciones.  Controlar su afeccin.  Recibir ayuda de inmediato si no mejora o si empeora.   Esta informacin no tiene Theme park manager el consejo del mdico. Asegrese de hacerle al mdico cualquier pregunta que tenga.   Document Released: 02/17/2005 Document Revised: 05/31/2014 Elsevier Interactive Patient Education [employer]

## 2015-12-23 NOTE — Assessment & Plan Note (Signed)
Offered option of isoniazid 300mg  daily x 9 months or rifampin 600mg  daily x 4 months.  Discussed risks and benefits of both medications.  Pt hesitant for any medication that can cause LFT elevation. He does take Usana supplements for immune health.  Will prescribe rifampin 4 mo course. Check LFTs at baseline today, return 2 mo to recheck LFTs. Pt agrees with plan.

## 2015-12-23 NOTE — Progress Notes (Signed)
   BP 118/82   Pulse (!) 56   Temp 98.2 F (36.8 C) (Oral)   Wt 157 lb 8 oz (71.4 kg)   BMI 26.21 kg/m    CC: f/u pos TB test Subjective:    Patient ID: Melvin Castillo, male    DOB: 1950/06/20, 65 y.o.   MRN: [id number]  HPI: Melvin Castillo is a 65 y.o. 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  Normal CXR 12/2013, 09/2015.  Quantiferon gold assay positive this month. Here to discuss implications.   Continues Usana supplements - planning on starting proglucamune and profavanol.   Relevant past medical, surgical, family and social history reviewed and updated as indicated. Interim medical history since our last visit reviewed. Allergies and medications reviewed and updated. Current Outpatient Prescriptions on File Prior to Visit  Medication Sig  . albuterol (PROAIR HFA) 108 (90 Base) MCG/ACT inhaler Inhale 2 puffs into the lungs every 6 (six) hours as needed for wheezing or shortness of breath.  . NON FORMULARY Usana supplements   No current facility-administered medications on file prior to visit.     Review of Systems Per HPI unless specifically indicated in ROS section     Objective:    BP 118/82   Pulse (!) 56   Temp 98.2 F (36.8 C) (Oral)   Wt 157 lb 8 oz (71.4 kg)   BMI 26.21 kg/m   Wt Readings from Last 3 Encounters:  12/23/15 157 lb 8 oz (71.4 kg)  12/08/15 159 lb (72.1 kg)  08/07/15 158 lb 12 oz (72 kg)    Physical Exam  Constitutional: He appears well-developed and well-nourished. No distress.  Psychiatric: He has a normal mood and affect.  Nursing note and vitals reviewed.  Results for orders placed or performed in visit on 12/08/15  Quantiferon tb gold assay  Result Value Ref Range   Interferon Gamma Release Assay POSITIVE (A) NEGATIVE   Quantiferon Nil Value 0.18 IU/mL   Mitogen-Nil >10.00 IU/mL   Quantiferon Tb Ag Minus Nil Value 7.89 IU/mL      Assessment & Plan:   Problem List  Items Addressed This Visit    Positive PPD - Primary    Offered option of isoniazid 300mg  daily x 9 months or rifampin 600mg  daily x 4 months.  Discussed risks and benefits of both medications.  Pt hesitant for any medication that can cause LFT elevation. He does take Usana supplements for immune health.  Will prescribe rifampin 4 mo course. Check LFTs at baseline today, return 2 mo to recheck LFTs. Pt agrees with plan.      Relevant Orders   Hepatic function panel    Other Visit Diagnoses   None.      Follow up plan: No Follow-up on file.  Eustaquio Boyden, MD

## 2015-12-25 ENCOUNTER — Encounter: Payer: Self-pay | Admitting: *Deleted

## 2016-02-04 ENCOUNTER — Encounter: Payer: Self-pay | Admitting: Family Medicine

## 2016-02-25 ENCOUNTER — Other Ambulatory Visit: Payer: Self-pay | Admitting: Family Medicine

## 2016-02-25 DIAGNOSIS — R7611 Nonspecific reaction to tuberculin skin test without active tuberculosis: Secondary | ICD-10-CM

## 2016-02-26 ENCOUNTER — Other Ambulatory Visit: Payer: BC Managed Care – PPO

## 2016-03-10 ENCOUNTER — Encounter: Payer: Self-pay | Admitting: Family Medicine

## 2017-04-29 ENCOUNTER — Other Ambulatory Visit: Payer: Self-pay

## 2017-04-29 ENCOUNTER — Ambulatory Visit: Payer: BC Managed Care – PPO | Admitting: Emergency Medicine

## 2017-04-29 ENCOUNTER — Encounter: Payer: Self-pay | Admitting: Emergency Medicine

## 2017-04-29 ENCOUNTER — Ambulatory Visit: Payer: Self-pay | Admitting: Emergency Medicine

## 2017-04-29 VITALS — BP 142/68 | HR 66 | Temp 98.5°F | Ht 65.25 in | Wt 161.0 lb

## 2017-04-29 DIAGNOSIS — R059 Cough, unspecified: Secondary | ICD-10-CM

## 2017-04-29 DIAGNOSIS — R05 Cough: Secondary | ICD-10-CM

## 2017-04-29 DIAGNOSIS — J069 Acute upper respiratory infection, unspecified: Secondary | ICD-10-CM

## 2017-04-29 DIAGNOSIS — J029 Acute pharyngitis, unspecified: Secondary | ICD-10-CM | POA: Insufficient documentation

## 2017-04-29 DIAGNOSIS — R0989 Other specified symptoms and signs involving the circulatory and respiratory systems: Secondary | ICD-10-CM | POA: Insufficient documentation

## 2017-04-29 DIAGNOSIS — R053 Chronic cough: Secondary | ICD-10-CM | POA: Insufficient documentation

## 2017-04-29 MED ORDER — ALBUTEROL SULFATE HFA 108 (90 BASE) MCG/ACT IN AERS: 2.0000 | INHALATION_SPRAY | Freq: Four times a day (QID) | RESPIRATORY_TRACT | 6 refills | Status: DC | PRN

## 2017-04-29 MED ORDER — AZITHROMYCIN 250 MG PO TABS: ORAL_TABLET | ORAL | 0 refills | Status: DC

## 2017-04-29 MED ORDER — PREDNISONE 20 MG PO TABS: 40.0000 mg | ORAL_TABLET | Freq: Every day | ORAL | 0 refills | Status: AC

## 2017-04-29 NOTE — Patient Instructions (Addendum)
Infeccin del tracto respiratorio superior, adultos (Upper Respiratory Infection, Adult) La mayora de las infecciones del tracto respiratorio superior estn causadas por un virus. Un infeccin del tracto respiratorio superior afecta la nariz, la garganta y las vas respiratorias superiores. El tipo ms comn de infeccin del tracto respiratorio superior es el resfro comn. CUIDADOS EN EL HOGAR  Tome los medicamentos solamente como se lo haya indicado el mdico.  A fin de Engineer, materialsaliviar el dolor de garganta, haga grgaras con solucin salina templada o consuma caramelos para la tos, como se lo haya indicado el mdico.  Use un humidificador de vapor clido o inhale el vapor de la ducha para aumentar la humedad del aire. Esto facilitar la respiracin.  Beba suficiente lquido para mantener el pis (orina) claro o de color amarillo plido.  Tome sopas y caldos transparentes.  Siga una dieta saludable.  Descanse todo lo que sea necesario.  Regrese al Aleen Campitrabajo cuando la fiebre haya desaparecido o el mdico le diga que puede Hutchisonhacerlo. ? Es posible que deba quedarse en su casa durante un tiempo prolongado, para no transmitir la infeccin a los dems. ? Tambin puede usar un barbijo y lavarse las manos con frecuencia para evitar el contagio del virus.  Si tiene asma, use el inhalador con mayor frecuencia.  No consuma ningn producto que contenga tabaco, lo que incluye cigarrillos, tabaco de Theatre managermascar o Administrator, Civil Servicecigarrillos electrnicos. Si necesita ayuda para dejar de fumar, consulte al mdico. SOLICITE AYUDA SI:  Siente que empeora o que no mejora.  Los medicamentos no logran [employer]bury Automotive Groupaliviar los sntomas.  Tiene escalofros.  La dificultad para Clinical cytogeneticistrespirar es peor.  Tiene mucosidad marrn o roja.  Tiene una secrecin amarilla o marrn de la Clinical cytogeneticistnariz.  Le duele la cara, especialmente al inclinarse hacia adelante.  Tiene fiebre.  Tiene los ganglios del cuello hinchados.  Siente dolor al tragar.  Tiene  zonas blancas en la parte de atrs de la garganta. SOLICITE AYUDA DE INMEDIATO SI:  Los siguientes sntomas son muy intensos o constantes: ? Dolor de [place]. ? Dolor de odos. ? Dolor en la frente, detrs de los ojos y por encima de los pmulos (dolor sinusal). ? Journalist, newspaperDolor en el pecho.  Tiene enfermedad pulmonar prolongada (crnica) y cualquiera de estos sntomas: ? Sibilancias. ? Tos prolongada. ? Tos con Montez Hagemansangre. ? Cambio en la mucosidad habitual.  Presenta rigidez en el cuello.  Tiene cambios en: ? La visin. ? La audicin. ? El pensamiento. ? El Lashmeetestado de nimo. ASEGRESE DE QUE:  Comprende estas instrucciones.  Controlar su afeccin.  Recibir ayuda de inmediato si no mejora o si empeora. Esta informacin no tiene Theme park managercomo fin reemplazar el consejo del mdico. Asegrese de hacerle al mdico cualquier pregunta que tenga. Document Released: 10/12/2010 Document Revised: 09/24/2014 Document Reviewed: 08/15/2013 Elsevier Interactive Patient Education  2018 Elsevier Inc.  Upper Respiratory Infection, Adult Most upper respiratory infections (URIs) are caused by a virus. A URI affects the nose, throat, and upper air passages. The most common type of URI is often called "the common cold." Follow these instructions at home:  Take medicines only as told by your doctor.  Gargle warm saltwater or take cough drops to comfort your throat as told by your doctor.  Use a warm mist humidifier or inhale steam from a shower to increase air moisture. This may make it easier to breathe.  Drink enough fluid to keep your pee (urine) clear or pale yellow.  Eat soups and other clear broths.  Have  a healthy diet.  Rest as needed.  Go back to work when your fever is gone or your doctor says it is okay. ? You may need to stay home longer to avoid giving your URI to others. ? You can also wear a face mask and wash your hands often to prevent spread of the virus.  Use your inhaler more if you  have asthma.  Do not use any tobacco products, including cigarettes, chewing tobacco, or electronic cigarettes. If you need help quitting, ask your doctor. Contact a doctor if:  You are getting worse, not better.  Your symptoms are not helped by medicine.  You have chills.  You are getting more short of breath.  You have brown or red mucus.  You have yellow or brown discharge from your nose.  You have pain in your face, especially when you bend forward.  You have a fever.  You have puffy (swollen) neck glands.  You have pain while swallowing.  You have white areas in the back of your throat. Get help right away if:  You have very bad or constant: ? Headache. ? Ear pain. ? Pain in your forehead, behind your eyes, and over your cheekbones (sinus pain). ? Chest pain.  You have long-lasting (chronic) lung disease and any of the following: ? Wheezing. ? Long-lasting cough. ? Coughing up blood. ? A change in your usual mucus.  You have a stiff neck.  You have changes in your: ? Vision. ? Hearing. ? Thinking. ? Mood. This information is not intended to replace advice given to you by your health care provider. Make sure you discuss any questions you have with your health care provider. Document Released: 10/27/2007 Document Revised: 01/11/2016 Document Reviewed: 08/15/2013 Elsevier Interactive Patient Education  2018 [employer]   IF you received an x-ray today, you will receive an invoice from [facility]. Please contact [facility] at [phone] with questions or concerns regarding your invoice.   IF you received labwork today, you will receive an invoice from [facility]. Please contact [facility] at [phone] with questions or concerns regarding your invoice.   Our billing staff will not be able to assist you with questions regarding bills from these companies.  You will be contacted with the lab results as soon as they are available. The  fastest way to get your results is to activate your My Chart account. Instructions are located on the last page of this paperwork. If you have not heard from us regarding the results in 2 weeks, please contact this office.

## 2017-04-29 NOTE — Progress Notes (Signed)
Melvin Castillo 66 y.o.   Chief Complaint  Patient presents with  . chest congestion    x 2 weeks per patient feels like something in chest goes boom boom  . Medication Refill    albuterol inhaler    HISTORY OF PRESENT ILLNESS: This is a 66 y.o. male complaining of 2 week h/o sore throat, chest congestion, and productive cough.  URI   This is a new problem. The current episode started 1 to 4 weeks ago. The problem has been gradually worsening. There has been no fever. Associated symptoms include chest pain (pleuritic), congestion, coughing and neck pain. Pertinent negatives include no abdominal pain, diarrhea, dysuria, ear pain, headaches, joint pain, joint swelling, nausea, rash, sinus pain, sore throat, swollen glands, vomiting or wheezing. He has tried nothing for the symptoms.     Prior to Admission medications   Medication Sig Start Date End Date Taking? Authorizing Provider  albuterol (PROAIR HFA) 108 (90 Base) MCG/ACT inhaler Inhale 2 puffs into the lungs every 6 (six) hours as needed for wheezing or shortness of breath. 12/08/15  Yes Eustaquio BoydenGutierrez, Javier, MD  NON FORMULARY Marcie MowersUsana supplements   Yes [provider]  rifampin (RIFADIN) 300 MG capsule Take 2 capsules (600 mg total) by mouth daily. Patient not taking: Reported on 04/29/2017 12/23/15   Eustaquio BoydenGutierrez, Javier, MD    Allergies  Allergen Reactions  . Lactose Intolerance (Gi) Other (See Comments)    Abd discomfort    Patient Active Problem List   Diagnosis Date Noted  . Health maintenance examination 12/08/2015  . Left elbow pain 08/07/2015  . 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 Allergic rhinitis   . Asthma     Past Surgical History:  Procedure Laterality Date  . INGUINAL HERNIA REPAIR Right ~1997    Social History   Socioeconomic History  . Marital status: Married    Spouse name: Not on file  . Number of children: Not on file  . Years of education: Not on file  .  Highest education level: Not on file  Social Needs  . Financial resource strain: Not on file  . Food insecurity - worry: Not on file  . Food insecurity - inability: Not on file  . Transportation needs - medical: Not on file  . Transportation needs - non-medical: Not on file  Occupational History  . Not on file  Tobacco Use  . Smoking status: Former Smoker    Last attempt to quit: 05/24/2005    Years since quitting: 11.9  . Smokeless tobacco: Never Used  Substance and Sexual Activity  . Alcohol use: No    Alcohol/week: 0.0 oz  . Drug use: No  . Sexual activity: Not Currently    Partners: Female  Other Topics Concern  . 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 Diabetes Mother   . Leukemia Sister   . CAD Neg Hx   . Stroke Neg Hx   . Alcohol abuse Brother      Review of Systems  Constitutional: Negative.  Negative for chills and fever.  HENT: Positive for congestion. Negative for ear pain, nosebleeds, sinus pain  and sore throat.   Eyes: Negative for discharge and redness.  Respiratory: Positive for cough. Negative for wheezing.   Cardiovascular: Positive for chest pain (pleuritic) and palpitations.  Gastrointestinal: Negative.  Negative for abdominal pain, diarrhea, nausea and vomiting.  Genitourinary: Negative for dysuria and hematuria.  Musculoskeletal: Positive for neck pain. Negative for joint pain.  Skin: Negative for rash.  Neurological: Negative.  Negative for dizziness and headaches.  Endo/Heme/Allergies: Negative.   All other systems reviewed and are negative.   Vitals:   04/29/17 1821  BP: (!) 142/68  Pulse: 66  Temp: 98.5 F (36.9 C)  SpO2: 97%    Physical Exam  Constitutional: He is oriented to  person, place, and time. He appears well-developed and well-nourished.  HENT:  Head: Normocephalic and atraumatic.  Right Ear: External ear normal.  Left Ear: External ear normal.  Mouth/Throat: Posterior oropharyngeal erythema present. No oropharyngeal exudate.  Eyes: Conjunctivae and EOM are normal. Pupils are equal, round, and reactive to light.  Neck: Normal range of motion. No JVD present. No thyromegaly present.  Cardiovascular: Normal rate, regular rhythm and normal heart sounds.  Pulmonary/Chest: Effort normal and breath sounds normal.  Abdominal: Soft. Bowel sounds are normal. He exhibits no distension. There is no tenderness.  Musculoskeletal: Normal range of motion.  Lymphadenopathy:    He has no cervical adenopathy.  Neurological: He is alert and oriented to person, place, and time. No sensory deficit. He exhibits normal muscle tone.  Skin: Skin is warm and dry. Capillary refill takes less than 2 seconds. No rash noted.  Psychiatric: He has a normal mood and affect. His behavior is normal.  Vitals reviewed.  NSR with VR50 No acute ischemic changes PR154 [id number]   ASSESSMENT & PLAN: Melvin Castillo was seen today for chest congestion and medication refill.  Diagnoses and all orders for this visit:  Chest congestion -     EKG 12-Lead -     albuterol (PROAIR HFA) 108 (90 Base) MCG/ACT inhaler; Inhale 2 puffs into the lungs every 6 (six) hours as needed for wheezing or shortness of breath. -     predniSONE (DELTASONE) 20 MG tablet; Take 2 tablets (40 mg total) by mouth daily with breakfast for 5 days.  Cough -     albuterol (PROAIR HFA) 108 (90 Base) MCG/ACT inhaler; Inhale 2 puffs into the lungs every 6 (six) hours as needed for wheezing or shortness of breath. -     predniSONE (DELTASONE) 20 MG tablet; Take 2 tablets (40 mg total) by mouth daily with breakfast for 5 days.  Sore throat  Acute upper respiratory infection -     azithromycin (ZITHROMAX) 250 MG tablet; Sig as  indicated    Patient Instructions      Infeccin del tracto respiratorio superior, adultos (Upper Respiratory Infection, Adult) La mayora de las infecciones del tracto respiratorio superior estn causadas por un virus. Un infeccin del tracto respiratorio superior afecta la nariz, la garganta y las vas respiratorias superiores. El tipo ms comn de infeccin del tracto respiratorio superior es el resfro comn. CUIDADOS EN EL HOGAR  Tome los medicamentos solamente como se lo haya indicado el mdico.  A fin de Engineer, materialsaliviar el dolor de garganta, haga grgaras con solucin salina templada o consuma caramelos para la tos, como se lo haya indicado el mdico.  Use un humidificador de vapor clido o inhale el vapor de la ducha para aumentar la humedad del aire. Esto facilitar la respiracin.  Beba suficiente lquido para Kimberly-Clarkmantener  el pis (orina) claro o de color amarillo plido.  Tome sopas y caldos transparentes.  Siga una dieta saludable.  Descanse todo lo que sea necesario.  Regrese al Aleen Campi cuando la fiebre haya desaparecido o el mdico le diga que puede [place]. ? Es posible que deba quedarse en su casa durante un tiempo prolongado, para no transmitir la infeccin a los dems. ? Tambin puede usar un barbijo y lavarse las manos con frecuencia para evitar el contagio del virus.  Si tiene asma, use el inhalador con mayor frecuencia.  No consuma ningn producto que contenga tabaco, lo que incluye cigarrillos, tabaco de Theatre manager o Administrator, Civil Service. Si necesita ayuda para dejar de fumar, consulte al mdico. SOLICITE AYUDA SI:  Siente que empeora o que no mejora.  Los medicamentos no logran [employer].  Tiene escalofros.  La dificultad para Clinical cytogeneticist.  Tiene mucosidad marrn o roja.  Tiene una secrecin amarilla o marrn de la Clinical cytogeneticist.  Le duele la cara, especialmente al inclinarse hacia adelante.  Tiene fiebre.  Tiene los ganglios del cuello  hinchados.  Siente dolor al tragar.  Tiene zonas blancas en la parte de atrs de la garganta. SOLICITE AYUDA DE INMEDIATO SI:  Los siguientes sntomas son muy intensos o constantes: ? Dolor de [place]. ? Dolor de odos. ? Dolor en la frente, detrs de los ojos y por encima de los pmulos (dolor sinusal). ? Journalist, newspaper.  Tiene enfermedad pulmonar prolongada (crnica) y cualquiera de estos sntomas: ? Sibilancias. ? Tos prolongada. ? Tos con Montez Hageman. ? Cambio en la mucosidad habitual.  Presenta rigidez en el cuello.  Tiene cambios en: ? La visin. ? La audicin. ? El pensamiento. ? El Reading de nimo. ASEGRESE DE QUE:  Comprende estas instrucciones.  Controlar su afeccin.  Recibir ayuda de inmediato si no mejora o si empeora. Esta informacin no tiene Theme park manager el consejo del mdico. Asegrese de hacerle al mdico cualquier pregunta que tenga. Document Released: 10/12/2010 Document Revised: 09/24/2014 Document Reviewed: 08/15/2013 Elsevier Interactive Patient Education  2018 Elsevier Inc.  Upper Respiratory Infection, Adult Most upper respiratory infections (URIs) are caused by a virus. A URI affects the nose, throat, and upper air passages. The most common type of URI is often called "the common cold." Follow these instructions at home:  Take medicines only as told by your doctor.  Gargle warm saltwater or take cough drops to comfort your throat as told by your doctor.  Use a warm mist humidifier or inhale steam from a shower to increase air moisture. This may make it easier to breathe.  Drink enough fluid to keep your pee (urine) clear or pale yellow.  Eat soups and other clear broths.  Have a healthy diet.  Rest as needed.  Go back to work when your fever is gone or your doctor says it is okay. ? You may need to stay home longer to avoid giving your URI to others. ? You can also wear a face mask and wash your hands often to prevent spread  of the virus.  Use your inhaler more if you have asthma.  Do not use any tobacco products, including cigarettes, chewing tobacco, or electronic cigarettes. If you need help quitting, ask your doctor. Contact a doctor if:  You are getting worse, not better.  Your symptoms are not helped by medicine.  You have chills.  You are getting more short of breath.  You have brown or red mucus.  You  have yellow or brown discharge from your nose.  You have pain in your face, especially when you bend forward.  You have a fever.  You have puffy (swollen) neck glands.  You have pain while swallowing.  You have white areas in the back of your throat. Get help right away if:  You have very bad or constant: ? Headache. ? Ear pain. ? Pain in your forehead, behind your eyes, and over your cheekbones (sinus pain). ? Chest pain.  You have long-lasting (chronic) lung disease and any of the following: ? Wheezing. ? Long-lasting cough. ? Coughing up blood. ? A change in your usual mucus.  You have a stiff neck.  You have changes in your: ? Vision. ? Hearing. ? Thinking. ? Mood. This information is not intended to replace advice given to you by your health care provider. Make sure you discuss any questions you have with your health care provider. Document Released: 10/27/2007 Document Revised: 01/11/2016 Document Reviewed: 08/15/2013 Elsevier Interactive Patient Education  2018 [employer].   IF you received an x-ray today, you will receive an invoice from [facility]. Please contact [facility] at [phone] with questions or concerns regarding your invoice.   IF you received labwork today, you will receive an invoice from [facility]. Please contact [facility] at [phone] with questions or concerns regarding your invoice.   Our billing staff will not be able to assist you with questions regarding bills from these companies.  You will be contacted with the lab  results as soon as they are available. The fastest way to get your results is to activate your My Chart account. Instructions are located on the last page of this paperwork. If you have not heard from [place] regarding the results in 2 weeks, please contact this office.         Edwina Barth, MD [facility]

## 2017-05-10 ENCOUNTER — Other Ambulatory Visit: Payer: Self-pay

## 2017-05-10 ENCOUNTER — Encounter: Payer: Self-pay | Admitting: Emergency Medicine

## 2017-05-10 ENCOUNTER — Ambulatory Visit (INDEPENDENT_AMBULATORY_CARE_PROVIDER_SITE_OTHER): Payer: Medicare Other | Admitting: Emergency Medicine

## 2017-05-10 VITALS — BP 130/58 | HR 87 | Temp 98.9°F | Resp 16 | Ht 65.25 in | Wt 161.8 lb

## 2017-05-10 DIAGNOSIS — Z0000 Encounter for general adult medical examination without abnormal findings: Secondary | ICD-10-CM | POA: Diagnosis not present

## 2017-05-10 NOTE — Progress Notes (Addendum)
Melvin Castillo 66 y.o.   Chief Complaint  Patient presents with  . Annual Exam    HISTORY OF PRESENT ILLNESS: This is a 66 y.o. male Here for annual exam; no complaints and no medical concerns.   HPI   Prior to Admission medications   Medication Sig Start Date End Date Taking? Authorizing Provider  albuterol (PROAIR HFA) 108 (90 Base) MCG/ACT inhaler Inhale 2 puffs into the lungs every 6 (six) hours as needed for wheezing or shortness of breath. 04/29/17  Yes Lajada Janes, Eilleen KempfMiguel Jose, MD  NON [facility] HearingFORMULARY Marcie MowersUsana supplements   Yes [provider]    Allergies  Allergen Reactions  . Lactose Intolerance (Gi) Other (See Comments)    Abd discomfort    Patient Active Problem List   Diagnosis Date Noted  . Chest congestion 04/29/2017  . Cough 04/29/2017  . Sore throat 04/29/2017  . Acute upper respiratory infection 04/29/2017  . Health maintenance examination 12/08/2015  . Left elbow pain 08/07/2015  . 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 Allergic rhinitis   . Asthma     Past Surgical History:  Procedure Laterality Date  . INGUINAL HERNIA REPAIR Right ~1997    Social History   Socioeconomic History  . Marital status: Married    Spouse name: Not on file  . Number of children: Not on file  . Years of education: Not on file  . Highest education level: Not on file  Social Needs  . Financial resource strain: Not on file  . Food insecurity - worry: Not on file  . Food insecurity - inability: Not on file  . Transportation needs - medical: Not on file  . Transportation needs - non-medical: Not on file  Occupational History  . Not on file  Tobacco Use  . Smoking status: Former Smoker    Last attempt to quit: 05/24/2005    Years since quitting: 11.9  . Smokeless tobacco: Never Used  Substance and Sexual Activity  . Alcohol use: No    Alcohol/week: 0.0 oz  . Drug use: No  . Sexual activity: Not Currently    Partners: Female    Other Topics Concern  . 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 Diabetes Mother   . Leukemia Sister   . CAD Neg Hx   . Stroke Neg Hx   . Alcohol abuse Brother      Review of Systems  Constitutional: Negative.  Negative for chills, fever, malaise/fatigue and weight loss.  HENT: Negative.   Eyes: Negative.   Respiratory: Negative.  Negative for cough and shortness of breath.   Cardiovascular: Negative.  Negative for chest pain and palpitations.  Gastrointestinal: Negative.  Negative for abdominal pain, diarrhea, nausea and vomiting.  Genitourinary: Negative.  Negative for dysuria, flank pain, frequency, hematuria and urgency.  Musculoskeletal: Negative.  Negative for back pain, myalgias and neck pain.  Skin: Negative.  Negative for rash.       Lump under left arm  Neurological: Negative.   Endo/Heme/Allergies: Negative.   Psychiatric/Behavioral: Negative.   All other systems reviewed and are negative.  Vitals:   05/10/17 1146  BP: Marland Kitchen(!)  130/58  Pulse: 87  Resp: 16  Temp: 98.9 F (37.2 C)  SpO2: 98%     Physical Exam  Constitutional: He is oriented to person, place, and time. He appears well-developed and well-nourished.  HENT:  Head: Normocephalic and atraumatic.  Right Ear: External ear normal.  Left Ear: External ear normal.  Mouth/Throat: Oropharynx is clear and moist.  Eyes: Conjunctivae and EOM are normal. Pupils are equal, round, and reactive to light.  Neck: Normal range of motion. Neck supple. No JVD present. No thyromegaly present.  Cardiovascular: Normal rate, regular rhythm and normal heart sounds.  Pulmonary/Chest: Effort normal and breath sounds normal.  Abdominal: Soft. Bowel sounds are normal.  He exhibits no distension. There is no tenderness.  Musculoskeletal: Normal range of motion.  Lymphadenopathy:    He has no cervical adenopathy.  Neurological: He is alert and oriented to person, place, and time. No sensory deficit. He exhibits normal muscle tone. Coordination normal.  Skin: Skin is warm and dry. Capillary refill takes less than 2 seconds.  Left axillary sebaceous cyst. Non-tender.  Psychiatric: He has a normal mood and affect. His behavior is normal.  Vitals reviewed.    ASSESSMENT & PLAN: Melvin Castillo was seen today for annual exam.  Diagnoses and all orders for this visit:  Routine general medical examination at a health care facility -     CBC with Differential -     Comprehensive metabolic panel -     Lipid panel -     TSH -     PSA(Must document that pt has been informed of limitations of PSA testing.)    Patient Instructions       IF you received an x-ray today, you will receive an invoice from [facility]. Please contact [facility] at [phone] with questions or concerns regarding your invoice.   IF you received labwork today, you will receive an invoice from [facility]. Please contact [facility] at [phone] with questions or concerns regarding your invoice.   Our billing staff will not be able to assist you with questions regarding bills from these companies.  You will be contacted with the lab results as soon as they are available. The fastest way to get your results is to activate your My Chart account. Instructions are located on the last page of this paperwork. If you have not heard from [place] regarding the results in 2 weeks, please contact this office.      Health Maintenance, Male A healthy lifestyle and preventive care is important for your health and wellness. Ask your health care provider about what schedule of regular examinations is right for you. What should I know about weight and diet? Eat a Healthy Diet  Eat  plenty of vegetables, fruits, whole grains, low-fat dairy products, and lean protein.  Do not eat a lot of foods high in solid fats, added sugars, or salt.  Maintain a Healthy Weight Regular exercise can help you achieve or maintain a healthy weight. You should:  Do at least 150 minutes of exercise each week. The exercise should increase your heart rate and make you sweat (moderate-intensity exercise).  Do strength-training exercises at least twice a week.  Watch Your Levels of Cholesterol and Blood Lipids  Have your blood tested for lipids and cholesterol every 5 years starting at 65 years of age. If you are at high risk for heart disease, you should start having your blood tested when you are 65 years old. You may need to have your  cholesterol levels checked more often if: ? Your lipid or cholesterol levels are high. ? You are older than 65 years of age. ? You are at high risk for heart disease.  What should I know about cancer screening? Many types of cancers can be detected early and may often be prevented. Lung Cancer  You should be screened every year for lung cancer if: ? You are a current smoker who has smoked for at least 30 years. ? You are a former smoker who has quit within the past 15 years.  Talk to your health care provider about your screening options, when you should start screening, and how often you should be screened.  Colorectal Cancer  Routine colorectal cancer screening usually begins at 65 years of age and should be repeated every 5-10 years until you are 65 years old. You may need to be screened more often if early forms of precancerous polyps or small growths are found. Your health care provider may recommend screening at an earlier age if you have risk factors for colon cancer.  Your health care provider may recommend using home test kits to check for hidden blood in the stool.  A small camera at the end of a tube can be used to examine your colon  (sigmoidoscopy or colonoscopy). This checks for the earliest forms of colorectal cancer.  Prostate and Testicular Cancer  Depending on your age and overall health, your health care provider may do certain tests to screen for prostate and testicular cancer.  Talk to your health care provider about any symptoms or concerns you have about testicular or prostate cancer.  Skin Cancer  Check your skin from head to toe regularly.  Tell your health care provider about any new moles or changes in moles, especially if: ? There is a change in a mole's size, shape, or color. ? You have a mole that is larger than a pencil eraser.  Always use sunscreen. Apply sunscreen liberally and repeat throughout the day.  Protect yourself by wearing long sleeves, pants, a wide-brimmed hat, and sunglasses when outside.  What should I know about heart disease, diabetes, and high blood pressure?  If you are 7518-65 years of age, have your blood pressure checked every 3-5 years. If you are 65 years of age or older, have your blood pressure checked every year. You should have your blood pressure measured twice-once when you are at a hospital or clinic, and once when you are not at a hospital or clinic. Record the average of the two measurements. To check your blood pressure when you are not at a hospital or clinic, you can use: ? An automated blood pressure machine at a pharmacy. ? A home blood pressure monitor.  Talk to your health care provider about your target blood pressure.  If you are between 1945-65 years old, ask your health care provider if you should take aspirin to prevent heart disease.  Have regular diabetes screenings by checking your fasting blood sugar level. ? If you are at a normal weight and have a low risk for diabetes, have this test once every three years after the age of 65. ? If you are overweight and have a high risk for diabetes, consider being tested at a younger age or more often.  A  one-time screening for abdominal aortic aneurysm (AAA) by ultrasound is recommended for men aged 65-75 years who are current or former smokers. What should I know about preventing infection? Hepatitis B If you  have a higher risk for hepatitis B, you should be screened for this virus. Talk with your health care provider to find out if you are at risk for hepatitis B infection. Hepatitis C Blood testing is recommended for:  Everyone born from [birth year] through 1964.  Anyone with known risk factors for hepatitis C.  Sexually Transmitted Diseases (STDs)  You should be screened each year for STDs including gonorrhea and chlamydia if: ? You are sexually active and are younger than 65 years of age. ? You are older than 65 years of age and your health care provider tells you that you are at risk for this type of infection. ? Your sexual activity has changed since you were last screened and you are at an increased risk for chlamydia or gonorrhea. Ask your health care provider if you are at risk.  Talk with your health care provider about whether you are at high risk of being infected with HIV. Your health care provider may recommend a prescription medicine to help prevent HIV infection.  What else can I do?  Schedule regular health, dental, and eye exams.  Stay current with your vaccines (immunizations).  Do not use any tobacco products, such as cigarettes, chewing tobacco, and e-cigarettes. If you need help quitting, ask your health care provider.  Limit alcohol intake to no more than 2 drinks per day. One drink equals 12 ounces of beer, 5 ounces of wine, or 1 ounces of hard liquor.  Do not use street drugs.  Do not share needles.  Ask your health care provider for help if you need support or information about quitting drugs.  Tell your health care provider if you often feel depressed.  Tell your health care provider if you have ever been abused or do not feel safe at home. This  information is not intended to replace advice given to you by your health care provider. Make sure you discuss any questions you have with your health care provider. Document Released: 11/06/2007 Document Revised: 01/07/2016 Document Reviewed: 02/11/2015 Elsevier Interactive Patient Education  2018 [employer].  American Heart Association (AHA) Exercise Recommendation  Being physically active is important to prevent heart disease and stroke, the nation's No. 1and No. 5killers. To improve overall cardiovascular health, we suggest at least 150 minutes per week of moderate exercise or 75 minutes per week of vigorous exercise (or a combination of moderate and vigorous activity). Thirty minutes a day, five times a week is an easy goal to remember. You will also experience benefits even if you divide your time into two or three segments of 10 to 15 minutes per day.  For people who would benefit from lowering their blood pressure or cholesterol, we recommend 40 minutes of aerobic exercise of moderate to vigorous intensity three to four times a week to lower the risk for heart attack and stroke.  Physical activity is anything that makes you move your body and burn calories.  This includes things like climbing stairs or playing sports. Aerobic exercises benefit your heart, and include walking, jogging, swimming or biking. Strength and stretching exercises are best for overall stamina and flexibility.  The simplest, positive change you can make to effectively improve your heart health is to start walking. It's enjoyable, free, easy, social and great exercise. A walking program is flexible and boasts high success rates because people can stick with it. It's easy for walking to become a regular and satisfying part of life.   For Overall Cardiovascular Health:  At least 30 minutes of moderate-intensity aerobic activity at least 5 days per week for a total of 150  OR   At least 25 minutes of vigorous  aerobic activity at least 3 days per week for a total of 75 minutes; or a combination of moderate- and vigorous-intensity aerobic activity  AND   Moderate- to high-intensity muscle-strengthening activity at least 2 days per week for additional health benefits.  For Lowering Blood Pressure and Cholesterol  An average 40 minutes of moderate- to vigorous-intensity aerobic activity 3 or 4 times per week  What if I can't make it to the time goal? Something is always better than nothing! And everyone has to start somewhere. Even if you've been sedentary for years, today is the day you can begin to make healthy changes in your life. If you don't think you'll make it for 30 or 40 minutes, set a reachable goal for today. You can work up toward your overall goal by increasing your time as you get stronger. Don't let all-or-nothing thinking rob you of doing what you can every day.  Source:[email or web address], MD [facility]

## 2017-05-10 NOTE — Patient Instructions (Addendum)
   IF you received an x-ray today, you will receive an invoice from [facility]. Please contact [facility] at [phone] with questions or concerns regarding your invoice.   IF you received labwork today, you will receive an invoice from [facility]. Please contact [facility] at [phone] with questions or concerns regarding your invoice.   Our billing staff will not be able to assist you with questions regarding bills from these companies.  You will be contacted with the lab results as soon as they are available. The fastest way to get your results is to activate your My Chart account. Instructions are located on the last page of this paperwork. If you have not heard from us regarding the results in 2 weeks, please contact this office.      Health Maintenance, Male A healthy lifestyle and preventive care is important for your health and wellness. Ask your health care provider about what schedule of regular examinations is right for you. What should I know about weight and diet? Eat a Healthy Diet  Eat plenty of vegetables, fruits, whole grains, low-fat dairy products, and lean protein.  Do not eat a lot of foods high in solid fats, added sugars, or salt.  Maintain a Healthy Weight Regular exercise can help you achieve or maintain a healthy weight. You should:  Do at least 150 minutes of exercise each week. The exercise should increase your heart rate and make you sweat (moderate-intensity exercise).  Do strength-training exercises at least twice a week.  Watch Your Levels of Cholesterol and Blood Lipids  Have your blood tested for lipids and cholesterol every 5 years starting at 65 years of age. If you are at high risk for heart disease, you should start having your blood tested when you are 65 years old. You may need to have your cholesterol levels checked more often if: ? Your lipid or cholesterol levels are high. ? You are older than 65 years of age. ? You  are at high risk for heart disease.  What should I know about cancer screening? Many types of cancers can be detected early and may often be prevented. Lung Cancer  You should be screened every year for lung cancer if: ? You are a current smoker who has smoked for at least 30 years. ? You are a former smoker who has quit within the past 15 years.  Talk to your health care provider about your screening options, when you should start screening, and how often you should be screened.  Colorectal Cancer  Routine colorectal cancer screening usually begins at 65 years of age and should be repeated every 5-10 years until you are 65 years old. You may need to be screened more often if early forms of precancerous polyps or small growths are found. Your health care provider may recommend screening at an earlier age if you have risk factors for colon cancer.  Your health care provider may recommend using home test kits to check for hidden blood in the stool.  A small camera at the end of a tube can be used to examine your colon (sigmoidoscopy or colonoscopy). This checks for the earliest forms of colorectal cancer.  Prostate and Testicular Cancer  Depending on your age and overall health, your health care provider may do certain tests to screen for prostate and testicular cancer.  Talk to your health care provider about any symptoms or concerns you have about testicular or prostate cancer.  Skin Cancer  Check your skin   from head to toe regularly.  Tell your health care provider about any new moles or changes in moles, especially if: ? There is a change in a mole's size, shape, or color. ? You have a mole that is larger than a pencil eraser.  Always use sunscreen. Apply sunscreen liberally and repeat throughout the day.  Protect yourself by wearing long sleeves, pants, a wide-brimmed hat, and sunglasses when outside.  What should I know about heart disease, diabetes, and high blood  pressure?  If you are 18-39 years of age, have your blood pressure checked every 3-5 years. If you are 40 years of age or older, have your blood pressure checked every year. You should have your blood pressure measured twice-once when you are at a hospital or clinic, and once when you are not at a hospital or clinic. Record the average of the two measurements. To check your blood pressure when you are not at a hospital or clinic, you can use: ? An automated blood pressure machine at a pharmacy. ? A home blood pressure monitor.  Talk to your health care provider about your target blood pressure.  If you are between 45-79 years old, ask your health care provider if you should take aspirin to prevent heart disease.  Have regular diabetes screenings by checking your fasting blood sugar level. ? If you are at a normal weight and have a low risk for diabetes, have this test once every three years after the age of 45. ? If you are overweight and have a high risk for diabetes, consider being tested at a younger age or more often.  A one-time screening for abdominal aortic aneurysm (AAA) by ultrasound is recommended for men aged 65-75 years who are current or former smokers. What should I know about preventing infection? Hepatitis B If you have a higher risk for hepatitis B, you should be screened for this virus. Talk with your health care provider to find out if you are at risk for hepatitis B infection. Hepatitis C Blood testing is recommended for:  Everyone born from 1945 through 1965.  Anyone with known risk factors for hepatitis C.  Sexually Transmitted Diseases (STDs)  You should be screened each year for STDs including gonorrhea and chlamydia if: ? You are sexually active and are younger than 65 years of age. ? You are older than 65 years of age and your health care provider tells you that you are at risk for this type of infection. ? Your sexual activity has changed since you were last  screened and you are at an increased risk for chlamydia or gonorrhea. Ask your health care provider if you are at risk.  Talk with your health care provider about whether you are at high risk of being infected with HIV. Your health care provider may recommend a prescription medicine to help prevent HIV infection.  What else can I do?  Schedule regular health, dental, and eye exams.  Stay current with your vaccines (immunizations).  Do not use any tobacco products, such as cigarettes, chewing tobacco, and e-cigarettes. If you need help quitting, ask your health care provider.  Limit alcohol intake to no more than 2 drinks per day. One drink equals 12 ounces of beer, 5 ounces of wine, or 1 ounces of hard liquor.  Do not use street drugs.  Do not share needles.  Ask your health care provider for help if you need support or information about quitting drugs.  Tell your health care   provider if you often feel depressed.  Tell your health care provider if you have ever been abused or do not feel safe at home. This information is not intended to replace advice given to you by your health care provider. Make sure you discuss any questions you have with your health care provider. Document Released: 11/06/2007 Document Revised: 01/07/2016 Document Reviewed: 02/11/2015 [employer] Interactive Patient Education  2018 [employer]  American Heart Association (AHA) Exercise Recommendation  Being physically active is important to prevent heart disease and stroke, the nation's No. 1and No. 5killers. To improve overall cardiovascular health, we suggest at least 150 minutes per week of moderate exercise or 75 minutes per week of vigorous exercise (or a combination of moderate and vigorous activity). Thirty minutes a day, five times a week is an easy goal to remember. You will also experience benefits even if you divide your time into two or three segments of 10 to 15 minutes per day.  For people who would  benefit from lowering their blood pressure or cholesterol, we recommend 40 minutes of aerobic exercise of moderate to vigorous intensity three to four times a week to lower the risk for heart attack and stroke.  Physical activity is anything that makes you move your body and burn calories.  This includes things like climbing stairs or playing sports. Aerobic exercises benefit your heart, and include walking, jogging, swimming or biking. Strength and stretching exercises are best for overall stamina and flexibility.  The simplest, positive change you can make to effectively improve your heart health is to start walking. It's enjoyable, free, easy, social and great exercise. A walking program is flexible and boasts high success rates because people can stick with it. It's easy for walking to become a regular and satisfying part of life.   For Overall Cardiovascular Health:  At least 30 minutes of moderate-intensity aerobic activity at least 5 days per week for a total of 150  OR   At least 25 minutes of vigorous aerobic activity at least 3 days per week for a total of 75 minutes; or a combination of moderate- and vigorous-intensity aerobic activity  AND   Moderate- to high-intensity muscle-strengthening activity at least 2 days per week for additional health benefits.  For Lowering Blood Pressure and Cholesterol  An average 40 minutes of moderate- to vigorous-intensity aerobic activity 3 or 4 times per week  What if I can't make it to the time goal? Something is always better than nothing! And everyone has to start somewhere. Even if you've been sedentary for years, today is the day you can begin to make healthy changes in your life. If you don't think you'll make it for 30 or 40 minutes, set a reachable goal for today. You can work up toward your overall goal by increasing your time as you get stronger. Don't let all-or-nothing thinking rob you of doing what you can every day.   Source:http://www.heart.org    

## 2017-05-11 ENCOUNTER — Encounter: Payer: Self-pay | Admitting: Radiology

## 2017-05-11 LAB — COMPREHENSIVE METABOLIC PANEL
ALT: 27 IU/L (ref 0–44)
AST: 23 IU/L (ref 0–40)
Albumin/Globulin Ratio: 1.6 (ref 1.2–2.2)
Albumin: 4.2 g/dL (ref 3.6–4.8)
Alkaline Phosphatase: 57 IU/L (ref 39–117)
BUN / CREAT RATIO: 15 (ref 10–24)
BUN: 15 mg/dL (ref 8–27)
Bilirubin Total: 0.5 mg/dL (ref 0.0–1.2)
CALCIUM: 9.4 mg/dL (ref 8.6–10.2)
CHLORIDE: 105 mmol/L (ref 96–106)
CO2: 24 mmol/L (ref 20–29)
CREATININE: 1.01 mg/dL (ref 0.76–1.27)
GFR, EST AFRICAN AMERICAN: 90 mL/min/{1.73_m2} (ref >59)
GFR, EST NON AFRICAN AMERICAN: 78 mL/min/{1.73_m2} (ref >59)
GLUCOSE: 97 mg/dL (ref 65–99)
Globulin, Total: 2.6 g/dL (ref 1.5–4.5)
Potassium: 4.7 mmol/L (ref 3.5–5.2)
Sodium: 143 mmol/L (ref 134–144)
TOTAL PROTEIN: 6.8 g/dL (ref 6.0–8.5)

## 2017-05-11 LAB — CBC WITH DIFFERENTIAL/PLATELET
BASOS ABS: 0 10*3/uL (ref 0.0–0.2)
BASOS: 0 %
EOS (ABSOLUTE): 0.1 10*3/uL (ref 0.0–0.4)
Eos: 2 %
Hematocrit: 45 % (ref 37.5–51.0)
Hemoglobin: 15.1 g/dL (ref 13.0–17.7)
IMMATURE GRANS (ABS): 0 10*3/uL (ref 0.0–0.1)
IMMATURE GRANULOCYTES: 1 %
LYMPHS: 31 %
Lymphocytes Absolute: 1.3 10*3/uL (ref 0.7–3.1)
MCH: 30 pg (ref 26.6–33.0)
MCHC: 33.6 g/dL (ref 31.5–35.7)
MCV: 89 fL (ref 79–97)
Monocytes Absolute: 0.3 10*3/uL (ref 0.1–0.9)
Monocytes: 8 %
NEUTROS ABS: 2.5 10*3/uL (ref 1.4–7.0)
Neutrophils: 58 %
PLATELETS: 201 10*3/uL (ref 150–379)
RBC: 5.04 x10E6/uL (ref 4.14–5.80)
RDW: 15.1 % (ref 12.3–15.4)
WBC: 4.3 10*3/uL (ref 3.4–10.8)

## 2017-05-11 LAB — LIPID PANEL
CHOL/HDL RATIO: 3.8 ratio (ref 0.0–5.0)
Cholesterol, Total: 165 mg/dL (ref 100–199)
HDL: 44 mg/dL (ref >39)
LDL CALC: 71 mg/dL (ref 0–99)
Triglycerides: 251 mg/dL — ABNORMAL HIGH (ref 0–149)
VLDL CHOLESTEROL CAL: 50 mg/dL — AB (ref 5–40)

## 2017-05-11 LAB — TSH: TSH: 1.52 u[IU]/mL (ref 0.450–4.500)

## 2017-05-11 LAB — PSA: PROSTATE SPECIFIC AG, SERUM: 1.7 ng/mL (ref 0.0–4.0)

## 2017-09-22 ENCOUNTER — Other Ambulatory Visit: Payer: Self-pay | Admitting: Family Medicine

## 2017-09-22 ENCOUNTER — Ambulatory Visit: Payer: Self-pay

## 2017-09-22 DIAGNOSIS — M542 Cervicalgia: Secondary | ICD-10-CM

## 2017-09-22 DIAGNOSIS — R0789 Other chest pain: Secondary | ICD-10-CM

## 2017-09-22 DIAGNOSIS — M545 Low back pain: Secondary | ICD-10-CM

## 2017-09-22 DIAGNOSIS — M546 Pain in thoracic spine: Secondary | ICD-10-CM

## 2018-04-25 ENCOUNTER — Other Ambulatory Visit: Payer: Self-pay

## 2018-04-25 ENCOUNTER — Other Ambulatory Visit: Payer: Self-pay | Admitting: Emergency Medicine

## 2018-04-25 ENCOUNTER — Encounter: Payer: Self-pay | Admitting: Emergency Medicine

## 2018-04-25 ENCOUNTER — Ambulatory Visit: Payer: BC Managed Care – PPO | Admitting: Emergency Medicine

## 2018-04-25 VITALS — BP 124/67 | HR 69 | Temp 98.8°F | Resp 16 | Ht 65.5 in | Wt 159.4 lb

## 2018-04-25 DIAGNOSIS — J069 Acute upper respiratory infection, unspecified: Secondary | ICD-10-CM

## 2018-04-25 DIAGNOSIS — E785 Hyperlipidemia, unspecified: Secondary | ICD-10-CM | POA: Diagnosis not present

## 2018-04-25 DIAGNOSIS — R05 Cough: Secondary | ICD-10-CM

## 2018-04-25 DIAGNOSIS — Z125 Encounter for screening for malignant neoplasm of prostate: Secondary | ICD-10-CM

## 2018-04-25 DIAGNOSIS — M7918 Myalgia, other site: Secondary | ICD-10-CM

## 2018-04-25 DIAGNOSIS — B349 Viral infection, unspecified: Secondary | ICD-10-CM

## 2018-04-25 DIAGNOSIS — R0989 Other specified symptoms and signs involving the circulatory and respiratory systems: Secondary | ICD-10-CM

## 2018-04-25 DIAGNOSIS — R059 Cough, unspecified: Secondary | ICD-10-CM

## 2018-04-25 MED ORDER — KETOROLAC TROMETHAMINE 2 % EX GEL
1.0000 | Freq: Two times a day (BID) | CUTANEOUS | 2 refills | Status: AC
Start: 2018-04-25 — End: 2018-04-30

## 2018-04-25 MED ORDER — PSEUDOEPHEDRINE-GUAIFENESIN ER 60-600 MG PO TB12: 1.0000 | ORAL_TABLET | Freq: Two times a day (BID) | ORAL | 1 refills | Status: AC

## 2018-04-25 MED ORDER — ALBUTEROL SULFATE HFA 108 (90 BASE) MCG/ACT IN AERS: 2.0000 | INHALATION_SPRAY | Freq: Four times a day (QID) | RESPIRATORY_TRACT | 6 refills | Status: AC | PRN

## 2018-04-25 MED ORDER — PREDNISONE 20 MG PO TABS: 20.0000 mg | ORAL_TABLET | Freq: Every day | ORAL | 0 refills | Status: AC

## 2018-04-25 NOTE — Progress Notes (Signed)
Melvin Castillo 67 y.o.   Chief Complaint  Patient presents with  . Cough     x 04/24/18 nonproductive, also discuss discuss previous labs  . Fever    started x 2 days ago    HISTORY OF PRESENT ILLNESS: This is a 67 y.o. male complaining of flulike symptoms that started 2 days ago.  Started with fever, watery nonbloody diarrhea, nausea and vomiting followed yesterday by runny nose, sore throat, and cough.  Initial symptoms gone now.  No longer vomiting.  No more diarrhea.  Able to eat and drink well.  Denies any other significant symptoms. Also complaining of left-sided upper back pain radiating up into his neck and down to the left shoulder for the last 6 months.  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 HPI   Prior to Admission medications   Medication Sig Start Date End Date Taking? Authorizing Provider  albuterol (PROAIR HFA) 108 (90 Base) MCG/ACT inhaler Inhale 2 puffs into the lungs every 6 (six) hours as needed for wheezing or shortness of breath. 04/29/17  Yes Amadea Keagy, Eilleen Kempf, MD  NON Vick[facility] Hearing Marcie Mowers supplements   Yes [provider]    Allergies  Allergen Reactions  . Lactose Intolerance (Gi) Other (See Comments)    Abd discomfort    Patient Active Problem List   Diagnosis Date Noted  . Chest congestion 04/29/2017  . Cough 04/29/2017  . Sore throat 04/29/2017  . Acute upper respiratory infection 04/29/2017  . Health maintenance examination 12/08/2015  . Left elbow pain 08/07/2015  . 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 Allergic rhinitis   . Asthma     Past Surgical History:  Procedure Laterality Date  . INGUINAL HERNIA REPAIR Right ~1997    Social History   Socioeconomic History  . Marital status: Married    Spouse name: Not on file  . Number of children: Not on file  . Years of education: Not on file  . Highest education level: Not on file  Occupational History  . Not  on file  Social Needs  . Financial resource strain: Not on file  . Food insecurity:    Worry: Not on file    Inability: Not on file  . Transportation needs:    Medical: Not on file    Non-medical: Not on file  Tobacco Use  . Smoking status: Former Smoker    Last attempt to quit: 05/24/2005    Years since quitting: 12.9  . Smokeless tobacco: Never Used  Substance and Sexual Activity  . Alcohol use: No    Alcohol/week: 0.0 standard drinks  . Drug use: No  . Sexual activity: Not Currently    Partners: Female  Lifestyle  . Physical activity:    Days per week: Not on file    Minutes per session: Not on file  . Stress: Not on file  Relationships  . Social connections:    Talks on phone: Not on file    Gets together: Not on file    Attends religious service: Not on file    Active member of club or organization: Not on file    Attends meetings of clubs or organizations: Not on file    Relationship status: Not on file  . Intimate partner violence:    Fear of current or ex partner: Not on file    Emotionally abused: Not on file    Physically  abused: Not on file    Forced sexual activity: Not on file  Other Topics Concern  . Not on file  Social History Narrative   From [place], spanish speaking   Lives alone, family nearby   Occ: [place] bus driver   Edu: HS    Very involved at [employer]    Activity: some stretching   Diet: good water, fruits/vegetables daily    Family History  Problem Relation Age of Onset  . Diabetes Mother   . Leukemia Sister   . CAD Neg Hx   . Stroke Neg Hx   . Alcohol abuse Brother      Review of Systems  Constitutional: Positive for fever. Negative for malaise/fatigue.  HENT: Positive for congestion and sore throat. Negative for ear pain and hearing loss.   Eyes: Negative for blurred vision, double vision, discharge and redness.  Respiratory: Positive for cough. Negative for hemoptysis, sputum production, shortness of breath and wheezing.    Cardiovascular: Negative for chest pain, palpitations and leg swelling.  Gastrointestinal: Positive for diarrhea, nausea and vomiting. Negative for abdominal pain, blood in stool and melena.  Genitourinary: Negative.  Negative for dysuria and hematuria.  Musculoskeletal: Negative for back pain, joint pain, myalgias and neck pain.  Skin: Negative for rash.  Neurological: Negative.  Negative for dizziness and headaches.  Endo/Heme/Allergies: Negative.   All other systems reviewed and are negative.   Vitals:   04/25/18 0852  BP: 124/67  Pulse: 69  Resp: 16  Temp: 98.8 F (37.1 C)  SpO2: 97%    Physical Exam  Constitutional: He is oriented to person, place, and time. He appears well-developed and well-nourished.  HENT:  Head: Normocephalic and atraumatic.  Right Ear: External ear normal.  Left Ear: External ear normal.  Nose: Nose normal.  Mouth/Throat: Oropharynx is clear and moist.  Eyes: Pupils are equal, round, and reactive to light. Conjunctivae and EOM are normal.  Neck: Normal range of motion. Neck supple. No thyromegaly present.  Cardiovascular: Normal rate, regular rhythm and normal heart sounds.  Pulmonary/Chest: Effort normal and breath sounds normal.  Abdominal: Soft. Bowel sounds are normal.  Musculoskeletal: Normal range of motion. He exhibits no edema.  Some tenderness to the left trapezius muscle area  Lymphadenopathy:    He has no cervical adenopathy.  Neurological: He is alert and oriented to person, place, and time. No sensory deficit. He exhibits normal muscle tone.  Skin: Skin is warm and dry. Capillary refill takes less than 2 seconds.  Psychiatric: He has a normal mood and affect. His behavior is normal.  Vitals reviewed.    ASSESSMENT & PLAN: Chun was seen today for cough and fever.  Diagnoses and all orders for this visit:  Viral illness -     pseudoephedrine-guaifenesin (MUCINEX D) 60-600 MG 12 hr tablet; Take 1 tablet by mouth every 12  (twelve) hours for 5 days. -     predniSONE (DELTASONE) 20 MG tablet; Take 1 tablet (20 mg total) by mouth daily with breakfast for 5 days.  Dyslipidemia -     Comprehensive metabolic panel -     Lipid panel  Acute upper respiratory infection -     CBC with Differential/Platelet -     pseudoephedrine-guaifenesin (MUCINEX D) 60-600 MG 12 hr tablet; Take 1 tablet by mouth every 12 (twelve) hours for 5 days. -     predniSONE (DELTASONE) 20 MG tablet; Take 1 tablet (20 mg total) by mouth daily with breakfast for 5 days.  Muscle pain, myofascial -     Ketorolac Tromethamine 2 % GEL; Apply 1 application topically 2 (two) times daily for 5 days.  Chest congestion -     albuterol (PROAIR HFA) 108 (90 Base) MCG/ACT inhaler; Inhale 2 puffs into the lungs every 6 (six) hours as needed for wheezing or shortness of breath.  Cough -     albuterol (PROAIR HFA) 108 (90 Base) MCG/ACT inhaler; Inhale 2 puffs into the lungs every 6 (six) hours as needed for wheezing or shortness of breath.    Patient Instructions       If you have lab work done today you will be contacted with your lab results within the next 2 weeks.  If you have not heard from us then please contact us. The fastest way to get your results is to register for My Chart.   IF you received an x-ray today, you will receive an invoice from [facility]. Please contact [facility] at [phone] with questions or concerns regarding your invoice.   IF you received labwork today, you will receive an invoice from [facility]. Please contact [facility] at [phone] with questions or concerns regarding your invoice.   Our billing staff will not be able to assist you with questions regarding bills from these companies.  You will be contacted with the lab results as soon as they are available. The fastest way to get your results is to activate your My Chart account. Instructions are located on the last page of this paperwork.  If you have not heard from us regarding the results in 2 weeks, please contact this office.     Enfermedades virales en los adultos (Viral Illness, Adult) Los virus son microbios diminutos que entran en el organismo de Neomia Dearuna persona y Doctor, general practicecausan enfermedades. Hay muchos tipos de virus diferentes y causan muchas clases de enfermedades. Las enfermedades virales pueden ser leves o graves. Pueden afectar diferentes partes del cuerpo. Las enfermedades frecuentes causadas por virus incluyen los resfros y Emergency planning/management officerla gripe. Adems, las enfermedades virales abarcan cuadros clnicos graves, como el VIH/sida (virus de inmunodeficiencia humana/sndrome de inmunodeficiencia adquirida). Se han identificado unos pocos virus asociados con determinados tipos de cncer. CULES SON LAS CAUSAS? Muchos tipos de virus pueden causar enfermedades. Los virus invaden las clulas del organismo, se multiplican y Estate agentprovocan la disfuncin o la muerte de las clulas infectadas. Cuando la clula muere, libera ms virus. Cuando esto ocurre, aparecen sntomas de la enfermedad, y el virus sigue diseminndose a otras clulas. Si el virus asume la funcin de la clula, puede hacer que esta se divida y crezca fuera de control, y este es el caso en el que un virus causa cncer. Los diferentes virus ingresan al organismo de Anheuser-Buschdistintas formas. Puede contraer un virus de la siguiente [place]:  Al ingerir alimentos o beber agua contaminados con el virus.  Al inhalar gotitas que una persona infectada liber en el aire al toser o estornudar.  Al tocar una superficie contaminada con el virus y Tenet Healthcareluego llevarse la mano a la boca, la nariz o los ojos.  Al ser picado por un insecto o mordido por un animal que son portadores del virus.  Al tener contacto sexual con Neomia Dearuna persona infectada por el virus.  Al tener contacto con sangre o lquidos que contienen el virus, ya sea a travs de un corte abierto o durante una transfusin. Si el virus ingresa al organismo,  el sistema de defensa del cuerpo (sistema inmunitario) Product/process development scientistintentar combatirlo. Puede correr Geologist, engineeringun riesgo  ms alto de tener una enfermedad viral si tiene el sistema inmunitario debilitado. CULES SON LOS SIGNOS O LOS SNTOMAS? Los sntomas varan en funcin del tipo de virus y de la ubicacin de las clulas que este invade. Los sntomas frecuentes de los principales tipos de enfermedades virales incluyen los siguientes: Virus del resfro y de la gripe  [place].  Dolor de [place].  Dolor de Advertising copywriter.  Dolores musculares.  Congestin nasal.  Tos. Virus del aparato digestivo (gastrointestinales)  Grant Ruts.  Dolor abdominal.  Nuseas.  Diarrea. Virus hepticos (hepatitis)  Prdida del apetito.  Cansancio.  Tono amarillento de la piel (ictericia). Virus del cerebro y la mdula espinal  [place].  Dolor de [place].  Rigidez en el cuello.  Nuseas y vmitos.  Confusin o somnolencia. Virus de la piel  Verrugas.  Picazn.  Erupcin cutnea. Virus de transmisin sexual  Secrecin.  Hinchazn.  Enrojecimiento.  Erupcin cutnea. CMO SE TRATA ESTA AFECCIN? Los virus pueden ser difciles de tratar porque se hospedan en las clulas. Los antibiticos no tratan los virus porque no llegan al interior de las clulas. El tratamiento de una enfermedad viral puede incluir lo siguiente:  Lawyer y beber abundantes lquidos.  Medicamentos para [employer]. Estos pueden incluir medicamentos de venta libre para Chief Technology Officer y la [place], medicamentos para la tos o la congestin y medicamentos para Actuary.  Medicamentos antivirales. Estos frmacos estn disponibles nicamente para determinados tipos de virus. Pueden ayudar a [employer] de la gripe, si se toman apenas comienza el cuadro. Tambin hay antivirales para la hepatitis y el VIH/sida. Algunas enfermedades virales pueden evitarse con vacunas. Un ejemplo frecuente es la vacuna antigripal. SIGA ESTAS  INDICACIONES EN SU CASA: Medicamentos  Tome los medicamentos de venta libre y los recetados solamente como se lo haya indicado el mdico.  Si le recetaron un antiviral, tmelo como se lo haya indicado el mdico. No deje de tomar los medicamentos aunque comience a Actor.  Infrmese sobre cundo los antibiticos son necesarios y cundo no. Los antibiticos no combaten a los virus. Si el mdico cree que usted puede tener una infeccin bacteriana as como una viral, tal vez le receten un antibitico. ? No solicite una receta de antibiticos si le han diagnosticado una enfermedad viral. Eso no har que la enfermedad pase ms rpidamente. ? Tomar antibiticos con frecuencia cuando no son necesarios puede derivar en resistencia a los antibiticos. Cuando esto ocurre, el medicamento pierde su eficacia contra las bacterias que normalmente combate. Instrucciones generales  Beba suficiente lquido para mantener la orina clara o de color amarillo plido.  Descanse todo lo que pueda.  Retome sus actividades normales como se lo haya indicado el mdico. Pregntele al mdico qu actividades son seguras para usted.  Concurra a todas las visitas de control como se lo haya indicado el mdico. Esto es importante. CMO SE EVITA ESTO? Tome estas medidas para reducir el riesgo de tener una infeccin viral:  Consuma una dieta sana y descanse mucho.  Lvese las manos frecuentemente con agua y [place]. Esto es especialmente importante cuando est en lugares pblicos. Use desinfectante para manos si no dispone de [place] y [place].  Evite el contacto cercano con amigos y familiares que tengan una infeccin viral.  Si viaja a las regiones donde las infecciones virales gastrointestinales son frecuentes, no tome agua ni coma alimentos crudos.  Mantngase al da con las vacunas. Vacnese contra la gripe todos los aos como se lo haya indicado el  mdico.  No comparta cepillos de dientes, cortaas, rasuradoras o  agujas con [employer].  Siempre tenga sexo con proteccin. COMUNQUESE CON UN MDICO SI:  Tiene sntomas de una enfermedad viral que no desaparecen.  Los sntomas regresan despus de haber desaparecido.  Los sntomas empeoran. SOLICITE AYUDA DE INMEDIATO SI:  Tiene dificultad para respirar.  Siente un dolor de cabeza intenso o rigidez en el cuello.  Tiene vmitos fuertes o dolor abdominal. Esta informacin no tiene Theme park manager el consejo del mdico. Asegrese de hacerle al mdico cualquier pregunta que tenga. Document Released: 01/15/2016 Document Revised: 01/15/2016 Document Reviewed: 09/19/2015 Elsevier Interactive Patient Education  2018 Elsevier Inc.      Edwina Barth, MD [facility]

## 2018-04-25 NOTE — Patient Instructions (Addendum)
If you have lab work done today you will be contacted with your lab results within the next 2 weeks.  If you have not heard from [place] then please contact us. The fastest way to get your results is to register for My Chart.   IF you received an x-ray today, you will receive an invoice from [facility]. Please contact [facility] at [phone] with questions or concerns regarding your invoice.   IF you received labwork today, you will receive an invoice from [place] [facility]. Please contact [facility] at [phone] with questions or concerns regarding your invoice.   Our billing staff will not be able to assist you with questions regarding bills from these companies.  You will be contacted with the lab results as soon as they are available. The fastest way to get your results is to activate your My Chart account. Instructions are located on the last page of this paperwork. If you have not heard from [place] regarding the results in 2 weeks, please contact this office.     Enfermedades virales en los adultos (Viral Illness, Adult) Los virus son microbios diminutos que entran en el organismo de Neomia Dear persona y Doctor, general practice. Hay muchos tipos de virus diferentes y causan muchas clases de enfermedades. Las enfermedades virales pueden ser leves o graves. Pueden afectar diferentes partes del cuerpo. Las enfermedades frecuentes causadas por virus incluyen los resfros y Emergency planning/management officer. Adems, las enfermedades virales abarcan cuadros clnicos graves, como el VIH/sida (virus de inmunodeficiencia humana/sndrome de inmunodeficiencia adquirida). Se han identificado unos pocos virus asociados con determinados tipos de cncer. CULES SON LAS CAUSAS? Muchos tipos de virus pueden causar enfermedades. Los virus invaden las clulas del organismo, se multiplican y Estate agent la disfuncin o la muerte de las clulas infectadas. Cuando la clula muere, libera ms virus. Cuando esto ocurre, aparecen sntomas  de la enfermedad, y el virus sigue diseminndose a otras clulas. Si el virus asume la funcin de la clula, puede hacer que esta se divida y crezca fuera de control, y este es el caso en el que un virus causa cncer. Los diferentes virus ingresan al organismo de [employer]. Puede contraer un virus de la siguiente [place]:  Al ingerir alimentos o beber agua contaminados con el virus.  Al inhalar gotitas que una persona infectada liber en el aire al toser o estornudar.  Al tocar una superficie contaminada con el virus y [facility] mano a la boca, la nariz o los ojos.  Al ser picado por un insecto o mordido por un animal que son portadores del virus.  Al tener contacto sexual con Neomia Dear persona infectada por el virus.  Al tener contacto con sangre o lquidos que contienen el virus, ya sea a travs de un corte abierto o durante una transfusin. Si el virus ingresa al organismo, el sistema de defensa del cuerpo (sistema inmunitario) Product/process development scientist. Puede correr un riesgo ms alto de tener una enfermedad viral si tiene el sistema inmunitario debilitado. CULES SON LOS SIGNOS O LOS SNTOMAS? Los sntomas varan en funcin del tipo de virus y de la ubicacin de las clulas que este invade. Los sntomas frecuentes de los principales tipos de enfermedades virales incluyen los siguientes: Virus del resfro y de la gripe  [place].  Dolor de [place].  Dolor de Advertising copywriter.  Dolores musculares.  Congestin nasal.  Tos. Virus del aparato digestivo (gastrointestinales)  Grant Ruts.  Dolor abdominal.  Nuseas.  Diarrea. Virus hepticos (hepatitis)  Prdida del apetito.  Cansancio.  Tono amarillento de la piel (ictericia). Virus del cerebro y la mdula espinal  [place].  Dolor de cabeza.  Rigidez en el cuello.  Nuseas y vmitos.  Confusin o somnolencia. Virus de la piel  Verrugas.  Picazn.  Erupcin cutnea. Virus de transmisin  sexual  Secrecin.  Hinchazn.  Enrojecimiento.  Erupcin cutnea. CMO SE TRATA ESTA AFECCIN? Los virus pueden ser difciles de tratar porque se hospedan en las clulas. Los antibiticos no tratan los virus porque no llegan al interior de las clulas. El tratamiento de una enfermedad viral puede incluir lo siguiente:  Descansar y beber abundantes lquidos.  Medicamentos para aliviar los sntomas. Estos pueden incluir medicamentos de venta libre para el dolor y la fiebre, medicamentos para la tos o la congestin y medicamentos para aliviar la diarrea.  Medicamentos antivirales. Estos frmacos estn disponibles nicamente para determinados tipos de virus. Pueden ayudar a aliviar los sntomas de la gripe, si se toman apenas comienza el cuadro. Tambin hay antivirales para la hepatitis y el VIH/sida. Algunas enfermedades virales pueden evitarse con vacunas. Un ejemplo frecuente es la vacuna antigripal. SIGA ESTAS INDICACIONES EN SU CASA: Medicamentos  Tome los medicamentos de venta libre y los recetados solamente como se lo haya indicado el mdico.  Si le recetaron un antiviral, tmelo como se lo haya indicado el mdico. No deje de tomar los medicamentos aunque comience a sentirse mejor.  Infrmese sobre cundo los antibiticos son necesarios y cundo no. Los antibiticos no combaten a los virus. Si el mdico cree que usted puede tener una infeccin bacteriana as como una viral, tal vez le receten un antibitico. ? No solicite una receta de antibiticos si le han diagnosticado una enfermedad viral. Eso no har que la enfermedad pase ms rpidamente. ? Tomar antibiticos con frecuencia cuando no son necesarios puede derivar en resistencia a los antibiticos. Cuando esto ocurre, el medicamento pierde su eficacia contra las bacterias que normalmente combate. Instrucciones generales  Beba suficiente lquido para mantener la orina clara o de color amarillo plido.  Descanse todo lo que  pueda.  Retome sus actividades normales como se lo haya indicado el mdico. Pregntele al mdico qu actividades son seguras para usted.  Concurra a todas las visitas de control como se lo haya indicado el mdico. Esto es importante. CMO SE EVITA ESTO? Tome estas medidas para reducir el riesgo de tener una infeccin viral:  Consuma una dieta sana y descanse mucho.  Lvese las manos frecuentemente con agua y jabn. Esto es especialmente importante cuando est en lugares pblicos. Use desinfectante para manos si no dispone de agua y jabn.  Evite el contacto cercano con amigos y familiares que tengan una infeccin viral.  Si viaja a las regiones donde las infecciones virales gastrointestinales son frecuentes, no tome agua ni coma alimentos crudos.  Mantngase al da con las vacunas. Vacnese contra la gripe todos los aos como se lo haya indicado el mdico.  No comparta cepillos de dientes, cortaas, rasuradoras o agujas con otras personas.  Siempre tenga sexo con proteccin. COMUNQUESE CON UN MDICO SI:  Tiene sntomas de una enfermedad viral que no desaparecen.  Los sntomas regresan despus de haber desaparecido.  Los sntomas empeoran. SOLICITE AYUDA DE INMEDIATO SI:  Tiene dificultad para respirar.  Siente un dolor de cabeza intenso o rigidez en el cuello.  Tiene vmitos fuertes o dolor abdominal. Esta informacin no tiene como fin reemplazar el consejo del mdico. Asegrese de hacerle al mdico cualquier pregunta que tenga. Document Released: 01/15/2016 Document   Revised: 01/15/2016 Document Reviewed: 09/19/2015 Elsevier Interactive Patient Education  [employer]2018 Elsevier Inc.

## 2018-04-25 NOTE — Addendum Note (Signed)
Addended by: Evie LacksSAGARDIA, Shebra Muldrow J on: 04/25/2018 09:44 AM   Modules accepted: Orders

## 2018-04-26 ENCOUNTER — Encounter: Payer: Self-pay | Admitting: Emergency Medicine

## 2018-04-26 LAB — LIPID PANEL
CHOL/HDL RATIO: 2.9 ratio (ref 0.0–5.0)
Cholesterol, Total: 100 mg/dL (ref 100–199)
HDL: 34 mg/dL — ABNORMAL LOW (ref >39)
LDL CALC: 44 mg/dL (ref 0–99)
Triglycerides: 109 mg/dL (ref 0–149)
VLDL Cholesterol Cal: 22 mg/dL (ref 5–40)

## 2018-04-26 LAB — CBC WITH DIFFERENTIAL/PLATELET
BASOS ABS: 0 10*3/uL (ref 0.0–0.2)
Basos: 0 %
EOS (ABSOLUTE): 0.2 10*3/uL (ref 0.0–0.4)
Eos: 5 %
Hematocrit: 46.3 % (ref 37.5–51.0)
Hemoglobin: 15.5 g/dL (ref 13.0–17.7)
Immature Grans (Abs): 0 10*3/uL (ref 0.0–0.1)
Immature Granulocytes: 0 %
LYMPHS ABS: 1.3 10*3/uL (ref 0.7–3.1)
Lymphs: 28 %
MCH: 29.6 pg (ref 26.6–33.0)
MCHC: 33.5 g/dL (ref 31.5–35.7)
MCV: 88 fL (ref 79–97)
MONOCYTES: 10 %
Monocytes Absolute: 0.5 10*3/uL (ref 0.1–0.9)
NEUTROS ABS: 2.7 10*3/uL (ref 1.4–7.0)
Neutrophils: 57 %
Platelets: 160 10*3/uL (ref 150–450)
RBC: 5.24 x10E6/uL (ref 4.14–5.80)
RDW: 13.6 % (ref 12.3–15.4)
WBC: 4.7 10*3/uL (ref 3.4–10.8)

## 2018-04-26 LAB — COMPREHENSIVE METABOLIC PANEL
ALK PHOS: 77 IU/L (ref 39–117)
ALT: 29 IU/L (ref 0–44)
AST: 24 IU/L (ref 0–40)
Albumin/Globulin Ratio: 1.6 (ref 1.2–2.2)
Albumin: 4.3 g/dL (ref 3.6–4.8)
BILIRUBIN TOTAL: 0.3 mg/dL (ref 0.0–1.2)
BUN/Creatinine Ratio: 9 — ABNORMAL LOW (ref 10–24)
BUN: 9 mg/dL (ref 8–27)
CO2: 19 mmol/L — AB (ref 20–29)
CREATININE: 1.04 mg/dL (ref 0.76–1.27)
Calcium: 8.6 mg/dL (ref 8.6–10.2)
Chloride: 104 mmol/L (ref 96–106)
GFR, EST AFRICAN AMERICAN: 86 mL/min/{1.73_m2} (ref >59)
GFR, EST NON AFRICAN AMERICAN: 74 mL/min/{1.73_m2} (ref >59)
GLUCOSE: 99 mg/dL (ref 65–99)
Globulin, Total: 2.7 g/dL (ref 1.5–4.5)
POTASSIUM: 4.1 mmol/L (ref 3.5–5.2)
SODIUM: 142 mmol/L (ref 134–144)
Total Protein: 7 g/dL (ref 6.0–8.5)

## 2018-04-26 LAB — PSA: PROSTATE SPECIFIC AG, SERUM: 1.6 ng/mL (ref 0.0–4.0)

## 2018-06-02 ENCOUNTER — Other Ambulatory Visit: Payer: Self-pay | Admitting: Emergency Medicine

## 2018-06-03 ENCOUNTER — Encounter (HOSPITAL_BASED_OUTPATIENT_CLINIC_OR_DEPARTMENT_OTHER): Payer: Self-pay | Admitting: Emergency Medicine

## 2018-06-03 ENCOUNTER — Emergency Department (HOSPITAL_BASED_OUTPATIENT_CLINIC_OR_DEPARTMENT_OTHER): Payer: BC Managed Care – PPO

## 2018-06-03 ENCOUNTER — Other Ambulatory Visit: Payer: Self-pay

## 2018-06-03 ENCOUNTER — Emergency Department (HOSPITAL_BASED_OUTPATIENT_CLINIC_OR_DEPARTMENT_OTHER)
Admission: EM | Admit: 2018-06-03 | Discharge: 2018-06-03 | Disposition: A | Discharge status: Home / Self Care | Payer: BC Managed Care – PPO | Attending: Emergency Medicine | Admitting: Emergency Medicine

## 2018-06-03 DIAGNOSIS — M25512 Pain in left shoulder: Secondary | ICD-10-CM

## 2018-06-03 DIAGNOSIS — Y999 Unspecified external cause status: Secondary | ICD-10-CM | POA: Diagnosis not present

## 2018-06-03 DIAGNOSIS — S161XXA Strain of muscle, fascia and tendon at neck level, initial encounter: Secondary | ICD-10-CM | POA: Diagnosis not present

## 2018-06-03 DIAGNOSIS — M545 Low back pain: Secondary | ICD-10-CM | POA: Insufficient documentation

## 2018-06-03 DIAGNOSIS — Y9389 Activity, other specified: Secondary | ICD-10-CM | POA: Diagnosis not present

## 2018-06-03 DIAGNOSIS — Y92411 Interstate highway as the place of occurrence of the external cause: Secondary | ICD-10-CM | POA: Insufficient documentation

## 2018-06-03 DIAGNOSIS — S199XXA Unspecified injury of neck, initial encounter: Secondary | ICD-10-CM | POA: Diagnosis present

## 2018-06-03 DIAGNOSIS — R109 Unspecified abdominal pain: Secondary | ICD-10-CM | POA: Diagnosis not present

## 2018-06-03 LAB — BASIC METABOLIC PANEL
Anion gap: 6 (ref 5–15)
BUN: 20 mg/dL (ref 8–23)
CO2: 23 mmol/L (ref 22–32)
Calcium: 8.8 mg/dL — ABNORMAL LOW (ref 8.9–10.3)
Chloride: 108 mmol/L (ref 98–111)
Creatinine, Ser: 0.98 mg/dL (ref 0.61–1.24)
GFR calc Af Amer: >60 mL/min (ref >60)
GFR calc non Af Amer: >60 mL/min (ref >60)
Glucose, Bld: 117 mg/dL — ABNORMAL HIGH (ref 70–99)
Potassium: 3.8 mmol/L (ref 3.5–5.1)
Sodium: 137 mmol/L (ref 135–145)

## 2018-06-03 LAB — CBC WITH DIFFERENTIAL/PLATELET
Abs Immature Granulocytes: 0.02 10*3/uL (ref 0.00–0.07)
Basophils Absolute: 0 10*3/uL (ref 0.0–0.1)
Basophils Relative: 1 %
EOS ABS: 0.1 10*3/uL (ref 0.0–0.5)
Eosinophils Relative: 3 %
HCT: 46.6 % (ref 39.0–52.0)
Hemoglobin: 15 g/dL (ref 13.0–17.0)
Immature Granulocytes: 0 %
Lymphocytes Relative: 21 %
Lymphs Abs: 1.1 10*3/uL (ref 0.7–4.0)
MCH: 29.6 pg (ref 26.0–34.0)
MCHC: 32.2 g/dL (ref 30.0–36.0)
MCV: 92.1 fL (ref 80.0–100.0)
Monocytes Absolute: 0.4 10*3/uL (ref 0.1–1.0)
Monocytes Relative: 8 %
Neutro Abs: 3.6 10*3/uL (ref 1.7–7.7)
Neutrophils Relative %: 67 %
Platelets: 176 10*3/uL (ref 150–400)
RBC: 5.06 MIL/uL (ref 4.22–5.81)
RDW: 13.6 % (ref 11.5–15.5)
WBC: 5.3 10*3/uL (ref 4.0–10.5)
nRBC: 0 % (ref 0.0–0.2)

## 2018-06-03 MED ORDER — METHOCARBAMOL 500 MG PO TABS: 500.0000 mg | ORAL_TABLET | Freq: Every evening | ORAL | 0 refills | Status: AC | PRN

## 2018-06-03 MED ORDER — IOPAMIDOL (ISOVUE-300) INJECTION 61%
100.0000 mL | Freq: Once | INTRAVENOUS | Status: AC
  Administered 2018-06-03: 100 mL via INTRAVENOUS

## 2018-06-03 MED ORDER — NAPROXEN 500 MG PO TABS: 500.0000 mg | ORAL_TABLET | Freq: Two times a day (BID) | ORAL | 0 refills | Status: AC

## 2018-06-03 NOTE — ED Notes (Signed)
CT tech stated pt sneezed, coughed and was flushed during contrast administration. MD informed. Skin clear, no angioedema. VSS. Pt denies itching , SOB or difficulty swallowing

## 2018-06-03 NOTE — ED Notes (Signed)
Patient transported to CT 

## 2018-06-03 NOTE — ED Notes (Signed)
ED Provider at bedside. 

## 2018-06-03 NOTE — Discharge Instructions (Addendum)
Take naproxen 2 times a day with meals.  Do not take other anti-inflammatories at the same time (Advil, Motrin, ibuprofen, Aleve). You may supplement with Tylenol if you need further pain control. Use robaxin as needed for muscle stiffness or soreness.  Have caution, this may make you tired or groggy.  Do not drive or operate heavy machinery while taking this medicine. Use ice packs or heating pads if this helps control your pain. Use muscle creams (salonpas,icy hot, bengay) as needed for pain.  You will likely have continued muscle stiffness and soreness over the next couple days.   Follow-up with primary care in 1 week if your symptoms are not improving. Return to the emergency room if you develop vision changes, vomiting, slurred speech, numbness, loss of bowel or bladder control, or any new or worsening symptoms.

## 2018-06-03 NOTE — ED Notes (Signed)
Pt and family understood dc material. NAD noted. Script given at dc 
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
## 2018-06-05 ENCOUNTER — Encounter: Payer: Self-pay | Admitting: Emergency Medicine

## 2018-06-08 ENCOUNTER — Encounter: Payer: Self-pay | Admitting: Emergency Medicine

## 2018-06-08 ENCOUNTER — Ambulatory Visit: Payer: Medicare Other | Admitting: Emergency Medicine

## 2018-06-08 ENCOUNTER — Other Ambulatory Visit: Payer: Self-pay

## 2018-06-08 DIAGNOSIS — T07XXXA Unspecified multiple injuries, initial encounter: Secondary | ICD-10-CM

## 2018-06-08 DIAGNOSIS — M542 Cervicalgia: Secondary | ICD-10-CM | POA: Diagnosis not present

## 2018-06-08 DIAGNOSIS — M7918 Myalgia, other site: Secondary | ICD-10-CM

## 2018-06-08 NOTE — Patient Instructions (Addendum)
If you have lab work done today you will be contacted with your lab results within the next 2 weeks.  If you have not heard from [place] then please contact us. The fastest way to get your results is to register for My Chart.   IF you received an x-ray today, you will receive an invoice from [facility]. Please contact [facility] at [phone] with questions or concerns regarding your invoice.   IF you received labwork today, you will receive an invoice from [facility]. Please contact [facility] at [phone] with questions or concerns regarding your invoice.   Our billing staff will not be able to assist you with questions regarding bills from these companies.  You will be contacted with the lab results as soon as they are available. The fastest way to get your results is to activate your My Chart account. Instructions are located on the last page of this paperwork. If you have not heard from [place] regarding the results in 2 weeks, please contact this office.      Dolor muscular en los adultos (Muscle Pain, Adult) El dolor muscular (mialgia) puede ser leve o intenso. La mayora de las veces, el dolor dura solo un corto perodo y desaparece sin tratamiento. Es normal sentir algo de Marketing executive despus de comenzar un programa de entrenamiento. Generalmente duelen aquellos msculos que no se utilizan con frecuencia. Puede haber muchas otras causas del dolor muscular, que incluyen las siguientes:  Uso excesivo del msculo o distensin muscular, en especial si la persona no est en buen estado fsico. Esta es la causa ms comn del dolor muscular.  Lesiones.  [place].  Virus, como el de la gripe.  Enfermedades infecciosas.  Una afeccin crnica que causa dolor de [place], fatiga y dolor muscular con la palpacin (fibromialgia).  Una afeccin, como el lupus, en la que el sistema del cuerpo encargado de combatir las enfermedades ataca a otros rganos (enfermedades  autoinmunes o Development worker, community).  Determinados medicamentos, como los inhibidores de la ECA y las estatinas. Para diagnosticar la causa del Marketing executive, su Special educational needs teacher un examen fsico y le har preguntas sobre el dolor y cundo comenz. Si el dolor muscular no comenz hace mucho tiempo, el mdico puede esperar antes de hacer diversas pruebas. Si el dolor muscular comenz hace mucho tiempo, el mdico puede decidir que es ms conveniente hacer pruebas de inmediato. En algunos casos, se pueden realizar pruebas para descartar ciertas afecciones o enfermedades. El tratamiento del dolor muscular depende de la causa. El cuidado en el hogar a menudo es suficiente para Stage manager. El mdico tambin puede recetarle un medicamento antiinflamatorio. INSTRUCCIONES PARA EL CUIDADO EN EL HOGAR Actividad  Si el uso excesivo del msculo est provocando dolor muscular: ? [place] sus actividades hasta que el dolor desaparezca. ? Si usted no hace actividad fsica con frecuencia, los ejercicios deben ser suaves y regulares. ? Realice precalentamiento antes de la actividad fsica. Elongue antes y despus de hacer [place]. Esto puede ayudar a International aid/development worker de que sufra dolor muscular.  No siga haciendo actividad fsica si el dolor es muy intenso. Este tipo de dolor podra indicar que se ha lesionado un msculo. Control del dolor y de las molestias  Si se lo indican, aplique hielo sobre el msculo dolorido: ? Field seismologist hielo en una bolsa plstica. ? Coloque una [employer] piel y la bolsa de hielo. ? Coloque el hielo durante 64mnutos, 2 a 3veces por da.  Tambin puede alternar [employer] aplicar hielo y Presenter, broadcasting como se lo haya indicado el mdico. Para aplicar calor, use la fuente de calor que el mdico le recomiende, como una compresa de calor hmedo o una almohadilla trmica. ? Coloque una [employer] piel y la fuente de Freight forwarder. ? Aplique el calor durante 20 a  62mnutos. ? Retire la fuente de calor si la piel se le pone de color rojo brillante. Esto es muy importante si no puede sentir el dolor, el calor ni el fro. Puede correr un riesgo mayor de sufrir quemaduras. Medicamentos  TDelphide venta libre y los recetados solamente como se lo haya indicado el mdico.  No conduzca ni use maquinaria pesada mientras toma analgsicos recetados. SOLICITE ATENCIN MDICA SI:  El dMarketing executiveempeora y los medicamentos no surten [place]  Tiene dolor muscular que dura ms de 3das.  Tiene una erupcin cutnea o fiebre junto con el dolor muscular.  Tiene dolor muscular despus de una picadura de garrapata.  Tiene dolor muscular mientras hace actividad fsica, aunque est en buen estado fsico.  Tiene enrojecimiento, sensibilidad o hinchazn junto con el dolor muscular.  Tiene dolor muscular despus de comenzar un medicamento nuevo o de cambiar la dosis de un medicamento. SOLICITE ATENCIN MDICA DE INMEDIATO SI:  Tiene dificultad para respirar.  Presenta dificultad para tragar.  Tiene dolor muscular junto con rigidez en el cuello, fiebre y vmitos.  Tiene debilidad muscular intensa o no puede mover una parte del cuerpo. Esta informacin no tiene cMarine scientistel consejo del mdico. Asegrese de hacerle al mdico cualquier pregunta que tenga. Document Released: 08/17/2007 Document Revised: 09/01/2015 Document Reviewed: 09/30/2015 Elsevier Interactive Patient Education  2019 [employer] Contusin (Contusion) Una contusin es un hematoma profundo. Las contusiones ocurren cuando una lesin causa un sangrado debajo de la piel. Los sntomas de hematoma incluyen dolor, hinchazn y cambio de color en la piel. La piel puede ponerse azul, morada o [place] CUIDADOS EN EL HOGAR  Mantenga la zona de la lesin en reposo.  Aplique hielo sobre la zona lesionada, si se lo indican. ? Ponga el hielo en una bolsa plstica. ? Coloque  una tGenuine Partspiel y la bolsa de hielo. ? Coloque el hielo durante 256mutos, 2 a 3veces por da.  Si se lo indican, ejerza una presin suave (compresin) en la zona de la lesin con una venda elstica. Asegrese de que la venda no est muMadagascarRetrela y vuelva a coLawyeromo se lo haya indicado el mdico.  Cuando est sentado o acostado, eleve la zona de la lesin por encima del nivel del corazn, si es posible.  Tome los medicamentos de venta libre y los recetados solamente como se lo haya indicado el mdico. SOLICITE AYUDA SI:  Los sntomas no mejoran despus de varios das de tr[place] Los sntomas empeoran.  Tiene dificultad para mover la zona de la lesin. SOLICITE AYUDA DE INMEDIATO SI:  Siente mucho dolor.  Pierde la sensibilidad (adormecimiento) en una mano o un pie.  La mano o el pie estn plidos o fros. Esta informacin no tiene coMarine scientistl consejo del mdico. Asegrese de hacerle al mdico cualquier pregunta que tenga. Document Released: 04/29/2011 Document Revised: 01/29/2015 Document Reviewed: 09/25/2014 Elsevier Interactive Patient Education  2019 [place] Muscle Pain, Adult Muscle pain (myalgia) may be mild or severe. In most cases, the pain lasts only a short time and it goes away without treatment. It  is normal to feel some muscle pain after starting a workout program. Muscles that have not been used often will be sore at first. Muscle pain may also be caused by many other things, including:  Overuse or muscle strain, especially if you are not in shape. This is the most common cause of muscle pain.  Injury.  Bruises.  Viruses, such as the flu.  Infectious diseases.  A chronic condition that causes muscle tenderness, fatigue, and headache (fibromyalgia).  A condition, such as lupus, in which the body's disease-fighting system attacks other organs in the body (autoimmune or rheumatologic diseases).  Certain drugs,  including ACE inhibitors and statins. To diagnose the cause of your muscle pain, your health care provider will do a physical exam and ask questions about the pain and when it began. If you have not had muscle pain for very long, your health care provider may want to wait before doing much testing. If your muscle pain has lasted a long time, your health care provider may want to run tests right away. In some cases, this may include tests to rule out certain conditions or illnesses. Treatment for muscle pain depends on the cause. Home care is often enough to relieve muscle pain. Your health care provider may also prescribe anti-inflammatory medicine. Follow these instructions at home: Activity  If overuse is causing your muscle pain: ? Slow down your activities until the pain goes away. ? Do regular, gentle exercises if you are not usually active. ? Warm up before exercising. Stretch before and after exercising. This can help lower the risk of muscle pain.  Do not continue working out if the pain is very bad. Bad pain could mean that you have injured a muscle. Managing pain and discomfort   If directed, apply ice to the sore muscle: ? Put ice in a plastic bag. ? Place a towel between your skin and the bag. ? Leave the ice on for 20 minutes, 2-3 times a day.  You may also alternate between applying ice and applying heat as told by your health care provider. To apply heat, use the heat source that your health care provider recommends, such as a moist heat pack or a heating pad. ? Place a towel between your skin and the heat source. ? Leave the heat on for 20-30 minutes. ? Remove the heat if your skin turns bright red. This is especially important if you are unable to feel pain, heat, or cold. You may have a greater risk of getting burned. Medicines  Take over-the-counter and prescription medicines only as told by your health care provider.  Do not drive or use heavy machinery while taking  prescription pain medicine. Contact a health care provider if:  Your muscle pain gets worse and medicines do not help.  You have muscle pain that lasts longer than 3 days.  You have a rash or fever along with muscle pain.  You have muscle pain after a tick bite.  You have muscle pain while working out, even though you are in good physical condition.  You have redness, soreness, or swelling along with muscle pain.  You have muscle pain after starting a new medicine or changing the dose of a medicine. Get help right away if:  You have trouble breathing.  You have trouble swallowing.  You have muscle pain along with a stiff neck, fever, and vomiting.  You have severe muscle weakness or cannot move part of your body. This information is not intended  to replace advice given to you by your health care provider. Make sure you discuss any questions you have with your health care provider. Document Released: 04/01/2006 Document Revised: 11/28/2015 Document Reviewed: 09/30/2015 Elsevier Interactive Patient Education  2019 [employer].
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
## 2018-07-17 ENCOUNTER — Ambulatory Visit: Payer: BC Managed Care – PPO | Admitting: Family Medicine

## 2018-07-17 ENCOUNTER — Other Ambulatory Visit: Payer: Self-pay

## 2018-07-17 ENCOUNTER — Ambulatory Visit (INDEPENDENT_AMBULATORY_CARE_PROVIDER_SITE_OTHER): Payer: BC Managed Care – PPO

## 2018-07-17 ENCOUNTER — Encounter: Payer: Self-pay | Admitting: Family Medicine

## 2018-07-17 VITALS — BP 120/76 | HR 67 | Temp 97.8°F | Resp 16 | Ht 65.0 in | Wt 160.4 lb

## 2018-07-17 DIAGNOSIS — R05 Cough: Secondary | ICD-10-CM

## 2018-07-17 DIAGNOSIS — R062 Wheezing: Secondary | ICD-10-CM

## 2018-07-17 DIAGNOSIS — J22 Unspecified acute lower respiratory infection: Secondary | ICD-10-CM

## 2018-07-17 DIAGNOSIS — R059 Cough, unspecified: Secondary | ICD-10-CM

## 2018-07-17 MED ORDER — PREDNISONE 20 MG PO TABS: 40.0000 mg | ORAL_TABLET | Freq: Every day | ORAL | 0 refills | Status: AC

## 2018-07-17 MED ORDER — BENZONATATE 100 MG PO CAPS: 100.0000 mg | ORAL_CAPSULE | Freq: Two times a day (BID) | ORAL | 0 refills | Status: AC | PRN

## 2018-07-17 MED ORDER — AZITHROMYCIN 250 MG PO TABS: ORAL_TABLET | ORAL | 0 refills | Status: DC

## 2018-07-17 NOTE — Patient Instructions (Addendum)
Chest x-ray looked okay, but with the persistent green phlegm can start antibiotic as we discussed.  Start prednisone for wheezing.  Okay to use albuterol if needed for cough or wheeze up to every 4 hours but expect that needed to lessen once prednisone starts to work.  You can use Tessalon Perles 3 times per day as needed for cough.  Return to the clinic or go to the nearest emergency room if any of your symptoms worsen or new symptoms occur.  Cough, Adult  Coughing is a reflex that clears your throat and your airways. Coughing helps to heal and protect your lungs. It is normal to cough occasionally, but a cough that happens with other symptoms or lasts a long time may be a sign of a condition that needs treatment. A cough may last only 2-3 weeks (acute), or it may last longer than 8 weeks (chronic). What are the causes? Coughing is commonly caused by:  Breathing in substances that irritate your lungs.  A viral or bacterial respiratory infection.  Allergies.  Asthma.  Postnasal drip.  Smoking.  Acid backing up from the stomach into the esophagus (gastroesophageal reflux).  Certain medicines.  Chronic lung problems, including COPD (or rarely, lung cancer).  Other medical conditions such as heart failure. Follow these instructions at home: Pay attention to any changes in your symptoms. Take these actions to help with your discomfort:  Take medicines only as told by your health care provider. ? If you were prescribed an antibiotic medicine, take it as told by your health care provider. Do not stop taking the antibiotic even if you start to feel better. ? Talk with your health care provider before you take a cough suppressant medicine.  Drink enough fluid to keep your urine clear or pale yellow.  If the air is dry, use a cold steam vaporizer or humidifier in your bedroom or your home to help loosen secretions.  Avoid anything that causes you to cough at work or at home.  If  your cough is worse at night, try sleeping in a semi-upright position.  Avoid cigarette smoke. If you smoke, quit smoking. If you need help quitting, ask your health care provider.  Avoid caffeine.  Avoid alcohol.  Rest as needed. Contact a health care provider if:  You have new symptoms.  You cough up pus.  Your cough does not get better after 2-3 weeks, or your cough gets worse.  You cannot control your cough with suppressant medicines and you are losing sleep.  You develop pain that is getting worse or pain that is not controlled with pain medicines.  You have a fever.  You have unexplained weight loss.  You have night sweats. Get help right away if:  You cough up blood.  You have difficulty breathing.  Your heartbeat is very fast. This information is not intended to replace advice given to you by your health care provider. Make sure you discuss any questions you have with your health care provider. Document Released: 11/06/2010 Document Revised: 10/16/2015 Document Reviewed: 07/17/2014 Elsevier Interactive Patient Education  2019 Elsevier Inc.   Bronchospasm, Adult  Bronchospasm is a tightening of the airways going into the lungs. During an episode, it may be harder to breathe. You may cough, and you may make a whistling sound when you breathe (wheeze). This condition often affects people with asthma. What are the causes? This condition is caused by swelling and irritation in the airways. It can be triggered by:  An  infection (common).  Seasonal allergies.  An allergic reaction.  Exercise.  Irritants. These include pollution, cigarette smoke, strong odors, aerosol sprays, and paint fumes.  Weather changes. Winds increase molds and pollens in the air. Cold air may cause swelling.  Stress and emotional upset. What are the signs or symptoms? Symptoms of this condition include:  Wheezing. If the episode was triggered by an allergy, wheezing may start  right away or hours later.  Nighttime coughing.  Frequent or severe coughing with a simple cold.  Chest tightness.  Shortness of breath.  Decreased ability to exercise. How is this diagnosed? This condition is usually diagnosed with a review of your medical history and a physical exam. Tests, such as lung function tests, are sometimes done to look for other conditions. The need for a chest X-ray depends on where the wheezing occurs and whether it is the first time you have wheezed. How is this treated? This condition may be treated with:  Inhaled medicines. These open up the airways and help you breathe. They can be taken with an inhaler or a nebulizer device.  Corticosteroid medicines. These may be given for severe bronchospasm, usually when it is associated with asthma.  Avoiding triggers, such as irritants, infection, or allergies. Follow these instructions at home: Medicines  Take over-the-counter and prescription medicines only as told by your health care provider.  If you need to use an inhaler or nebulizer to take your medicine, ask your health care provider to explain how to use it correctly. If you were given a spacer, always use it with your inhaler. Lifestyle  Reduce the number of triggers in your home. To do this: ? Change your heating and air conditioning filter at least once a month. ? Limit your use of fireplaces and wood stoves. ? Do not smoke. Do not allow smoking in your home. ? Avoid using perfumes and fragrances. ? Get rid of pests, such as roaches and mice, and their droppings. ? Remove any mold from your home. ? Keep your house clean and dust free. Use unscented cleaning products. ? Replace carpet with wood, tile, or vinyl flooring. Carpet can trap dander and dust. ? Use allergy-proof pillows, mattress covers, and box spring covers. ? Wash bed sheets and blankets every week in hot water. Dry them in a dryer. ? Use blankets that are made of polyester or  cotton. ? Wash your hands often. ? Do not allow pets in your bedroom.  Avoid breathing in cold air when you exercise. General instructions  Have a plan for seeking medical care. Know when to call your health care provider and local emergency services, and where to get emergency care.  Stay up to date on your immunizations.  When you have an episode of bronchospasm, stay calm. Try to relax and breathe more slowly.  If you have asthma, make sure you have an asthma action plan.  Keep all follow-up visits as told by your health care provider. This is important. Contact a health care provider if:  You have muscle aches.  You have chest pain.  The mucus that you cough up (sputum) changes from clear or white to yellow, green, gray, or bloody.  You have a fever.  Your sputum gets thicker. Get help right away if:  Your wheezing and coughing get worse, even after you take your prescribed medicines.  It gets even harder to breathe.  You develop severe chest pain. Summary  Bronchospasm is a tightening of the airways going into  the lungs.  During an episode of bronchospasm, you may have a harder time breathing. You may cough and make a whistling sound when you breathe (wheeze).  Avoid exposure to triggers such as smoke, dust, mold, animal dander, and fragrances.  When you have an episode of bronchospasm, stay calm. Try to relax and breathe more slowly. This information is not intended to replace advice given to you by your health care provider. Make sure you discuss any questions you have with your health care provider. Document Released: 05/13/2003 Document Revised: 05/06/2016 Document Reviewed: 05/06/2016 Elsevier Interactive Patient Education  [employer].   If you have lab work done today you will be contacted with your lab results within the next 2 weeks.  If you have not heard from [place] then please contact us. The fastest way to get your results is to register for My  Chart.   IF you received an x-ray today, you will receive an invoice from [facility]. Please contact [facility] at [phone] with questions or concerns regarding your invoice.   IF you received labwork today, you will receive an invoice from [place]. Please contact [facility] at [phone] with questions or concerns regarding your invoice.   Our billing staff will not be able to assist you with questions regarding bills from these companies.  You will be contacted with the lab results as soon as they are available. The fastest way to get your results is to activate your My Chart account. Instructions are located on the last page of this paperwork. If you have not heard from [place] regarding the results in 2 weeks, please contact this office.

## 2018-07-17 NOTE — Progress Notes (Signed)
Subjective:    Patient ID: Melvin Castillo, male    DOB: January 19, 1951, 68 y.o.   MRN: [id number]  HPI Melvin Castillo is a 68 y.o. male Presents today for: Chief Complaint  Patient presents with  . URI    cough, running nose, bodyache from all the coughing,sob, some dizziness X7days   Started with cough 8 days ago, runny nose, bodyaches with cough. Shortness of breath  With trying to take deep breath causes coughing fit.  Dizzy after coughing fits only.  Feels like a little bit better since yesterday. But cough is same.  Has used albuterol in past, but no known hx of asthma/copd. Prednisone tolerated in December.  No known fever.  Green phlegm.   No flu vaccine this year.   Tx: Mucus D relief. Albuterol 2 puffs Q 4 hours - helps with cough.   Patient Active Problem List   Diagnosis Date Noted  . Muscle pain, myofascial 04/25/2018  . Viral illness 04/25/2018  . Dyslipidemia 04/25/2018  . Chest congestion 04/29/2017  . Cough 04/29/2017  . Sore throat 04/29/2017  . Acute upper respiratory infection 04/29/2017  . Health maintenance examination 12/08/2015  . Left elbow pain 08/07/2015  . 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 Allergic rhinitis   . Asthma    Past Surgical History:  Procedure Laterality Date  . INGUINAL HERNIA REPAIR Right ~1997   Allergies  Allergen Reactions  . Lactose Intolerance (Gi) Other (See Comments)    Abd discomfort   Prior to Admission medications   Medication Sig Start Date End Date Taking? Authorizing Provider  albuterol (PROAIR HFA) 108 (90 Base) MCG/ACT inhaler Inhale 2 puffs into the lungs every 6 (six) hours as needed for wheezing or shortness of breath. 04/25/18  Yes Sagardia, Eilleen Kempf, MD  methocarbamol (ROBAXIN) 500 MG tablet Take 1 tablet (500 mg total) by mouth at bedtime as needed for muscle spasms. 06/03/18  Yes Caccavale, Sophia, PA-C  naproxen (NAPROSYN) 500 MG tablet Take 1 tablet (500  mg total) by mouth 2 (two) times daily with a meal. 06/03/18  Yes Caccavale, Sophia, PA-C  NON FORMULARY [place] supplements   Yes [provider]   Social History   Socioeconomic History  . Marital status: Widowed    Spouse name: Not on file  . Number of children: Not on file  . Years of education: Not on file  . Highest education level: Not on file  Occupational History  . Not on file  Social Needs  . Financial resource strain: Not on file  . Food insecurity:    Worry: Not on file    Inability: Not on file  . Transportation needs:    Medical: Not on file    Non-medical: Not on file  Tobacco Use  . Smoking status: Never Smoker  . Smokeless tobacco: Never Used  Substance and Sexual Activity  . Alcohol use: Not Currently  . Drug use: Not Currently  . Sexual activity: Not Currently    Partners: Female  Lifestyle  . Physical activity:    Days per week: Not on file    Minutes per session: Not on file  . Stress: Not on file  Relationships  . Social connections:    Talks on phone: Not on file    Gets together: Not on file    Attends religious service: Not on file    Active member of club or organization: Not on file  Attends meetings of clubs or organizations: Not on file    Relationship status: Not on file  . Intimate partner violence:    Fear of current or ex partner: Not on file    Emotionally abused: Not on file    Physically abused: Not on file    Forced sexual activity: Not on file  Other Topics Concern  . 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      Objective:   Physical Exam Vitals signs reviewed.  Constitutional:      Appearance: He is well-developed.  HENT:     Head: Normocephalic  and atraumatic.     Right Ear: Tympanic membrane, ear canal and external ear normal.     Left Ear: Tympanic membrane, ear canal and external ear normal.     Nose: No rhinorrhea.     Mouth/Throat:     Pharynx: No oropharyngeal exudate or posterior oropharyngeal erythema.  Eyes:     Conjunctiva/sclera: Conjunctivae normal.     Pupils: Pupils are equal, round, and reactive to light.  Neck:     Musculoskeletal: Neck supple.  Cardiovascular:     Rate and Rhythm: Normal rate and regular rhythm.     Heart sounds: Normal heart sounds. No murmur.  Pulmonary:     Effort: Pulmonary effort is normal.     Breath sounds: Normal breath sounds. No wheezing, rhonchi or rales.     Comments: Speaking in full sentences, clear on exam.  Abdominal:     Palpations: Abdomen is soft.     Tenderness: There is no abdominal tenderness.  Lymphadenopathy:     Cervical: No cervical adenopathy.  Skin:    General: Skin is warm and dry.     Findings: No rash.  Neurological:     Mental Status: He is alert and oriented to person, place, and time.  Psychiatric:        Behavior: Behavior normal.    Vitals:   07/17/18 1201  BP: 120/76  Pulse: 67  Resp: 16  Temp: 97.8 F (36.6 C)  TempSrc: Oral  SpO2: 97%  Weight: 160 lb 6.4 oz (72.8 kg)  Height:  (1.651 m)   Dg Chest 2 View  Result Date: 07/17/2018 CLINICAL DATA:  Cough, wheezing. EXAM: CHEST - 2 VIEW COMPARISON:  None. FINDINGS: The heart size and mediastinal contours are within normal limits. Both lungs are clear. The visualized skeletal structures are unremarkable. IMPRESSION: No active cardiopulmonary disease. Electronically Signed   By: Lupita Raider, M.D.   On: 07/17/2018 13:12       Assessment & Plan:    Melvin Castillo is a 68 y.o. male Cough - Plan: DG Chest 2 View, benzonatate (TESSALON) 100 MG capsule  LRTI (lower respiratory tract infection) - Plan: azithromycin (ZITHROMAX) 250 MG tablet  Wheezing - Plan: predniSONE  (DELTASONE) 20 MG tablet  Persistent cough with discolored phlegm is above.  Frequent use of albuterol, suspect some reactive airway component, differential includes atypicals with lower respiratory infection.  Reassuring chest x-ray.  -Start prednisone, potential effect discussed, tolerated in December.  Albuterol as needed, has refills.  Azithromycin, with potential side effects of meds discussed.  Tessalon Perles as needed with  RTC precautions given.  Meds ordered this encounter  Medications  . predniSONE (DELTASONE) 20 MG tablet    Sig: Take 2 tablets (40 mg total) by mouth daily with breakfast.    Dispense:  10 tablet    Refill:  0  . azithromycin (ZITHROMAX) 250 MG tablet    Sig: Take 2 pills by mouth on day 1, then 1 pill by mouth per day on days 2 through 5.    Dispense:  6 tablet    Refill:  0  . benzonatate (TESSALON) 100 MG capsule    Sig: Take 1 capsule (100 mg total) by mouth 2 (two) times daily as needed for cough.    Dispense:  20 capsule    Refill:  0   Patient Instructions    Chest x-ray looked okay, but with the persistent green phlegm can start antibiotic as we discussed.  Start prednisone for wheezing.  Okay to use albuterol if needed for cough or wheeze up to every 4 hours but expect that needed to lessen once prednisone starts to work.  You can use Tessalon Perles 3 times per day as needed for cough.  Return to the clinic or go to the nearest emergency room if any of your symptoms worsen or new symptoms occur.  Cough, Adult  Coughing is a reflex that clears your throat and your airways. Coughing helps to heal and protect your lungs. It is normal to cough occasionally, but a cough that happens with other symptoms or lasts a long time may be a sign of a condition that needs treatment. A cough may last only 2-3 weeks (acute), or it may last longer than 8 weeks (chronic). What are the causes? Coughing is commonly caused by:  Breathing in substances that irritate your  lungs.  A viral or bacterial respiratory infection.  Allergies.  Asthma.  Postnasal drip.  Smoking.  Acid backing up from the stomach into the esophagus (gastroesophageal reflux).  Certain medicines.  Chronic lung problems, including COPD (or rarely, lung cancer).  Other medical conditions such as heart failure. Follow these instructions at home: Pay attention to any changes in your symptoms. Take these actions to help with your discomfort:  Take medicines only as told by your health care provider. ? If you were prescribed an antibiotic medicine, take it as told by your health care provider. Do not stop taking the antibiotic even if you start to feel better. ? Talk with your health care provider before you take a cough suppressant medicine.  Drink enough fluid to keep your urine clear or pale yellow.  If the air is dry, use a cold steam vaporizer or humidifier in your bedroom or your home to help loosen secretions.  Avoid anything that causes you to cough at work or at home.  If your cough is worse at night, try sleeping in a semi-upright position.  Avoid cigarette smoke. If you smoke, quit smoking. If you need help quitting, ask your health care provider.  Avoid caffeine.  Avoid alcohol.  Rest as needed. Contact a health care provider if:  You have new symptoms.  You cough up pus.  Your cough does not get better after 2-3 weeks, or your cough gets worse.  You cannot control your cough with suppressant medicines and you are losing sleep.  You develop pain that is getting worse or pain that is not controlled with pain medicines.  You have a fever.  You have unexplained weight loss.  You have night  sweats. Get help right away if:  You cough up blood.  You have difficulty breathing.  Your heartbeat is very fast. This information is not intended to replace advice given to you by your health care provider. Make sure you discuss any questions you have with  your health care provider. Document Released: 11/06/2010 Document Revised: 10/16/2015 Document Reviewed: 07/17/2014 Elsevier Interactive Patient Education  2019 Elsevier Inc.   Bronchospasm, Adult  Bronchospasm is a tightening of the airways going into the lungs. During an episode, it may be harder to breathe. You may cough, and you may make a whistling sound when you breathe (wheeze). This condition often affects people with asthma. What are the causes? This condition is caused by swelling and irritation in the airways. It can be triggered by:  An infection (common).  Seasonal allergies.  An allergic reaction.  Exercise.  Irritants. These include pollution, cigarette smoke, strong odors, aerosol sprays, and paint fumes.  Weather changes. Winds increase molds and pollens in the air. Cold air may cause swelling.  Stress and emotional upset. What are the signs or symptoms? Symptoms of this condition include:  Wheezing. If the episode was triggered by an allergy, wheezing may start right away or hours later.  Nighttime coughing.  Frequent or severe coughing with a simple cold.  Chest tightness.  Shortness of breath.  Decreased ability to exercise. How is this diagnosed? This condition is usually diagnosed with a review of your medical history and a physical exam. Tests, such as lung function tests, are sometimes done to look for other conditions. The need for a chest X-ray depends on where the wheezing occurs and whether it is the first time you have wheezed. How is this treated? This condition may be treated with:  Inhaled medicines. These open up the airways and help you breathe. They can be taken with an inhaler or a nebulizer device.  Corticosteroid medicines. These may be given for severe bronchospasm, usually when it is associated with asthma.  Avoiding triggers, such as irritants, infection, or allergies. Follow these instructions at home: Medicines  Take  over-the-counter and prescription medicines only as told by your health care provider.  If you need to use an inhaler or nebulizer to take your medicine, ask your health care provider to explain how to use it correctly. If you were given a spacer, always use it with your inhaler. Lifestyle  Reduce the number of triggers in your home. To do this: ? Change your heating and air conditioning filter at least once a month. ? Limit your use of fireplaces and wood stoves. ? Do not smoke. Do not allow smoking in your home. ? Avoid using perfumes and fragrances. ? Get rid of pests, such as roaches and mice, and their droppings. ? Remove any mold from your home. ? Keep your house clean and dust free. Use unscented cleaning products. ? Replace carpet with wood, tile, or vinyl flooring. Carpet can trap dander and dust. ? Use allergy-proof pillows, mattress covers, and box spring covers. ? Wash bed sheets and blankets every week in hot water. Dry them in a dryer. ? Use blankets that are made of polyester or cotton. ? Wash your hands often. ? Do not allow pets in your bedroom.  Avoid breathing in cold air when you exercise. General instructions  Have a plan for seeking medical care. Know when to call your health care provider and local emergency services, and where to get emergency care.  Stay up to date  on your immunizations.  When you have an episode of bronchospasm, stay calm. Try to relax and breathe more slowly.  If you have asthma, make sure you have an asthma action plan.  Keep all follow-up visits as told by your health care provider. This is important. Contact a health care provider if:  You have muscle aches.  You have chest pain.  The mucus that you cough up (sputum) changes from clear or white to yellow, green, gray, or bloody.  You have a fever.  Your sputum gets thicker. Get help right away if:  Your wheezing and coughing get worse, even after you take your prescribed  medicines.  It gets even harder to breathe.  You develop severe chest pain. Summary  Bronchospasm is a tightening of the airways going into the lungs.  During an episode of bronchospasm, you may have a harder time breathing. You may cough and make a whistling sound when you breathe (wheeze).  Avoid exposure to triggers such as smoke, dust, mold, animal dander, and fragrances.  When you have an episode of bronchospasm, stay calm. Try to relax and breathe more slowly. This information is not intended to replace advice given to you by your health care provider. Make sure you discuss any questions you have with your health care provider. Document Released: 05/13/2003 Document Revised: 05/06/2016 Document Reviewed: 05/06/2016 Elsevier Interactive Patient Education  [employer].   If you have lab work done today you will be contacted with your lab results within the next 2 weeks.  If you have not heard from [place] then please contact us. The fastest way to get your results is to register for My Chart.   IF you received an x-ray today, you will receive an invoice from [facility]. Please contact [facility] at [phone] with questions or concerns regarding your invoice.   IF you received labwork today, you will receive an invoice from [place]. Please contact [facility] at [phone] with questions or concerns regarding your invoice.   Our billing staff will not be able to assist you with questions regarding bills from these companies.  You will be contacted with the lab results as soon as they are available. The fastest way to get your results is to activate your My Chart account. Instructions are located on the last page of this paperwork. If you have not heard from [place] regarding the results in 2 weeks, please contact this office.       Signed,   Meredith Staggers, MD Primary Care at [facility].  07/17/18 1:32 PM

## 2018-08-08 ENCOUNTER — Encounter: Payer: Self-pay | Admitting: Emergency Medicine

## 2018-08-08 ENCOUNTER — Ambulatory Visit (INDEPENDENT_AMBULATORY_CARE_PROVIDER_SITE_OTHER): Payer: BC Managed Care – PPO | Admitting: Emergency Medicine

## 2018-08-08 ENCOUNTER — Other Ambulatory Visit: Payer: Self-pay

## 2018-08-08 VITALS — BP 122/66 | HR 57 | Temp 98.1°F | Resp 16 | Wt 159.8 lb

## 2018-08-08 DIAGNOSIS — R05 Cough: Secondary | ICD-10-CM | POA: Diagnosis not present

## 2018-08-08 DIAGNOSIS — K2100 Gastro-esophageal reflux disease with esophagitis, without bleeding: Secondary | ICD-10-CM

## 2018-08-08 DIAGNOSIS — Z23 Encounter for immunization: Secondary | ICD-10-CM

## 2018-08-08 DIAGNOSIS — J029 Acute pharyngitis, unspecified: Secondary | ICD-10-CM

## 2018-08-08 DIAGNOSIS — R053 Chronic cough: Secondary | ICD-10-CM

## 2018-08-08 MED ORDER — FAMOTIDINE 40 MG PO TABS: 40.0000 mg | ORAL_TABLET | Freq: Every day | ORAL | 3 refills | Status: AC

## 2018-08-08 MED ORDER — PANTOPRAZOLE SODIUM 40 MG PO TBEC: 40.0000 mg | DELAYED_RELEASE_TABLET | Freq: Every day | ORAL | 3 refills | Status: AC

## 2018-08-08 NOTE — Progress Notes (Signed)
Melvin Castillo 68 y.o.   Chief Complaint  Patient presents with  . Cough    per patient to follow up 07/17/2018  . Gastroesophageal Reflux    x 2 weeks-per patient not sure if medication caused it    HISTORY OF PRESENT ILLNESS: This is a 68 y.o. male complaining of heartburn for the past 2 weeks, mostly 1 to 2 hours after eating with belching.  No nausea or vomiting.  Has chronic cough and chronic sore throat likely secondary to GERD.  On no medications.  No other significant symptoms.  HPI   Prior to Admission medications   Medication Sig Start Date End Date Taking? Authorizing Provider  NON FORMULARY Usana supplements   Yes [provider]  albuterol (PROAIR HFA) 108 (90 Base) MCG/ACT inhaler Inhale 2 puffs into the lungs every 6 (six) hours as needed for wheezing or shortness of breath. Patient not taking: Reported on 08/08/2018 04/25/18   Georgina Quint, MD  benzonatate (TESSALON) 100 MG capsule Take 1 capsule (100 mg total) by mouth 2 (two) times daily as needed for cough. Patient not taking: Reported on 08/08/2018 07/17/18   Shade Flood, MD  methocarbamol (ROBAXIN) 500 MG tablet Take 1 tablet (500 mg total) by mouth at bedtime as needed for muscle spasms. Patient not taking: Reported on 08/08/2018 06/03/18   Caccavale, Sophia, PA-C  naproxen (NAPROSYN) 500 MG tablet Take 1 tablet (500 mg total) by mouth 2 (two) times daily with a meal. Patient not taking: Reported on 08/08/2018 06/03/18   Caccavale, Sophia, PA-C  predniSONE (DELTASONE) 20 MG tablet Take 2 tablets (40 mg total) by mouth daily with breakfast. Patient not taking: Reported on 08/08/2018 07/17/18   Shade Flood, MD    Allergies  Allergen Reactions  . Lactose Intolerance (Gi) Other (See Comments)    Abd discomfort    Patient Active Problem List   Diagnosis Date Noted  . Muscle pain, myofascial 04/25/2018  . Viral illness 04/25/2018  . Dyslipidemia 04/25/2018  . Chest congestion  04/29/2017  . Cough 04/29/2017  . Sore throat 04/29/2017  . Acute upper respiratory infection 04/29/2017  . Health maintenance examination 12/08/2015  . Left elbow pain 08/07/2015  . 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 Allergic rhinitis   . Asthma     Past Surgical History:  Procedure Laterality Date  . INGUINAL HERNIA REPAIR Right ~1997    Social History   Socioeconomic History  . Marital status: Widowed    Spouse name: Not on file  . Number of children: Not on file  . Years of education: Not on file  . Highest education level: Not on file  Occupational History  . Not on file  Social Needs  . Financial resource strain: Not on file  . Food insecurity:    Worry: Not on file    Inability: Not on file  . Transportation needs:    Medical: Not on file    Non-medical: Not on file  Tobacco Use  . Smoking status: Never Smoker  . Smokeless tobacco: Never Used  Substance and Sexual Activity  . Alcohol use: Not Currently  . Drug use: Not Currently  . Sexual activity: Not Currently    Partners: Female  Lifestyle  . Physical activity:    Days per week: Not on file    Minutes per session: Not on file  . Stress: Not on file  Relationships  . Social connections:  Talks on phone: Not on file    Gets together: Not on file    Attends religious service: Not on file    Active member of club or organization: Not on file    Attends meetings of clubs or organizations: Not on file    Relationship status: Not on file  . Intimate partner violence:    Fear of current or ex partner: Not on file    Emotionally abused: Not on file    Physically abused: Not on file    Forced sexual activity: Not on file  Other Topics Concern  . 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 Diabetes Mother   . Leukemia Sister   . CAD Neg Hx   . Stroke Neg Hx   . Alcohol abuse Brother      Review of Systems  Constitutional: Negative.  Negative for chills and fever.  HENT: Positive for sore throat. Negative for congestion.   Eyes: Negative.   Respiratory: Positive for cough.   Cardiovascular: Negative.  Negative for chest pain and palpitations.  Gastrointestinal: Positive for heartburn. Negative for abdominal pain, blood in stool, diarrhea, melena, nausea and vomiting.  Genitourinary: Negative.   Musculoskeletal: Negative.  Negative for back pain, myalgias and neck pain.  Skin: Negative.   Neurological: Negative.  Negative for dizziness and headaches.  Endo/Heme/Allergies: Negative.   All other systems reviewed and are negative.    Vitals:   08/08/18 1146  BP: 122/66  Pulse: (!) 57  Resp: 16  Temp: 98.1 F (36.7 C)  SpO2: 98%     Physical Exam Vitals signs reviewed.  Constitutional:      Appearance: Normal appearance.  HENT:     Head: Normocephalic and atraumatic.     Nose: Nose normal.     Mouth/Throat:     Mouth: Mucous membranes are moist.     Pharynx: Oropharynx is clear. Posterior oropharyngeal erythema present. No oropharyngeal exudate.  Eyes:     Extraocular Movements: Extraocular movements intact.     Conjunctiva/sclera: Conjunctivae normal.     Pupils: Pupils are equal, round, and reactive to light.  Neck:     Musculoskeletal: Normal range of motion and neck supple.  Cardiovascular:     Rate and Rhythm: Normal rate and regular rhythm.     Pulses: Normal pulses.     Heart sounds: Normal heart sounds.  Pulmonary:     Effort: Pulmonary effort is normal.     Breath sounds: Normal breath sounds.  Abdominal:     General: There is no distension.     Palpations: Abdomen is soft.     Tenderness: There is no abdominal tenderness.   Musculoskeletal: Normal range of motion.  Skin:    General: Skin is warm and dry.  Neurological:     Mental Status: He is alert.    A total of 25 minutes was spent in the room with the patient, greater than 50% of which was in counseling/coordination of care regarding differential diagnosis, GERD, management, medications, diet and nutrition, and need for follow-up.   ASSESSMENT & PLAN: Melvin Castillo was seen today for cough and gastroesophageal reflux.  Diagnoses and all orders for  this visit:  Gastroesophageal reflux disease with esophagitis -     pantoprazole (PROTONIX) 40 MG tablet; Take 1 tablet (40 mg total) by mouth daily. -     famotidine (PEPCID) 40 MG tablet; Take 1 tablet (40 mg total) by mouth at bedtime for 14 days.  Need for prophylactic vaccination against Streptococcus pneumoniae (pneumococcus) -     Pneumococcal conjugate vaccine 13-valent IM  Need for prophylactic vaccination and inoculation against influenza -     Flu vaccine HIGH DOSE PF  Chronic cough  Sore throat    Patient Instructions       If you have lab work done today you will be contacted with your lab results within the next 2 weeks.  If you have not heard from [place] then please contact us. The fastest way to get your results is to register for My Chart.   IF you received an x-ray today, you will receive an invoice from [facility]. Please contact [facility] at [phone] with questions or concerns regarding your invoice.   IF you received labwork today, you will receive an invoice from [place]. Please contact [facility] at [phone] with questions or concerns regarding your invoice.   Our billing staff will not be able to assist you with questions regarding bills from these companies.  You will be contacted with the lab results as soon as they are available. The fastest way to get your results is to activate your My Chart account. Instructions are located on the last page of  this paperwork. If you have not heard from [place] regarding the results in 2 weeks, please contact this office.     Enfermedad de reflujo gastroesofgico en los adultos Gastroesophageal Reflux Disease, Adult El reflujo gastroesofgico (RGE) ocurre cuando el cido del estmago sube por el tubo que conecta la boca con el estmago (esfago). Normalmente, la comida baja por el esfago y se mantiene en el [address], donde se la digiere. Cuando una persona tiene RGE, los alimentos y el cido estomacal suelen volver al esfago. Usted puede tener una enfermedad llamada enfermedad de reflujo gastroesofgico (ERGE) si el reflujo:  Sucede a menudo.  Causa sntomas frecuentes o muy intensos.  Causa problemas tales como dao en el esfago. Cuando esto ocurre, el esfago duele y se hincha (inflama). Con el tiempo, la ERGE puede ocasionar pequeos agujeros (lceras) en el revestimiento del esfago. Cules son las causas? Esta afeccin se debe a un problema en el msculo que se encuentra entre el esfago y [place]. Cuando este msculo est dbil o no es normal, no se cierra correctamente para impedir que los alimentos y el cido regresen del Teaching laboratory technician. El msculo puede debilitarse debido a lo siguiente:  El consumo de [place].  [place].  Tener cierto tipo de hernia (hernia de hiato).  Consumo de alcohol.  Ciertos alimentos y bebidas, como caf, chocolate, cebollas y [place]. Qu incrementa el riesgo? Es ms probable que tenga esta afeccin si:  Tiene sobrepeso.  Tiene una enfermedad que afecta el tejido conjuntivo.  [place] antiinflamatorios no esteroideos (AINE). Cules son los signos o los sntomas? Los sntomas de esta afeccin incluyen:  Acidez estomacal.  Dificultad o dolor al tragar.  Sensacin de Warehouse manager un bulto en la garganta.  Sabor amargo en la boca.  Mal aliento.  Tener una gran cantidad de saliva.  Estmago inflamado o con Dentist.  Eructos.  Dolor en el pecho. El dolor de  pecho puede deberse a distintas afecciones. Asegrese de Science writer a su  mdico si tiene dolor en el pecho.  Falta de aire o respiracin ruidosa (sibilancias).  Tos constante (crnica) o durante la noche.  Desgaste de la superficie de los dientes (esmalte dental).  Prdida de peso. Cmo se trata? El tratamiento depender de la gravedad de los sntomas. El mdico puede sugerirle lo siguiente:  Cambios en la dieta.  Medicamentos.  [place]. Siga estas indicaciones en su casa: Comida y bebida   Siga una dieta como se lo haya indicado el mdico. Es posible que deba evitar alimentos y bebidas, por ejemplo: ? Caf y t (con o sin cafena). ? Bebidas que contengan alcohol. ? Bebidas energticas y deportivas. ? Bebidas gaseosas y refrescos. ? Chocolate y cacao. ? Menta y esencia de [place]. ? Ajo y cebolla. ? Rbano picante. ? Alimentos cidos y condimentados. Estos incluyen todos los tipos de pimientos, [place] en polvo, curry en polvo, vinagre, salsas picantes y [employer] Petroleum. ? Ctricos y sus jugos, por ejemplo, naranjas, limones y limas. ? Alimentos que [employer]. Estos incluyen salsa roja, [place], salsa picante y pizza con salsa de [place]. ? Alimentos fritos y Lexicographer. Estos incluyen donas, papas fritas, papitas fritas de bolsa y aderezos con alto contenido de [place] ? Carnes con alto contenido de [place]). Estas incluye los perros calientes, chuletas o costillas, embutidos, jamn y tocino. ? Productos lcteos ricos en grasas, como leche [place], [place] y [place] crema.  Consuma pequeas cantidades de comida con ms frecuencia. Evite consumir porciones abundantes.  Evite beber grandes cantidades de lquidos con las comidas.  Evite comer 2 o 3horas antes de acostarse.  Evite recostarse inmediatamente despus de comer.  No haga ejercicios enseguida despus de comer. Estilo de vida   No consuma ningn producto que contenga nicotina o tabaco. Estos incluyen cigarrillos,  cigarrillos electrnicos y tabaco para Theatre manager. Si necesita ayuda para dejar de fumar, consulte al [employer].  Intente reducir [employer] de estrs. Si necesita ayuda para hacer esto, consulte al mdico.  Si tiene sobrepeso, baje una cantidad de peso saludable para usted. Consulte a su mdico para bajar de peso de [employer]. Indicaciones generales  Est atento a cualquier cambio en los sntomas.  Tome los medicamentos de venta libre y los recetados solamente como se lo haya indicado el mdico. No tome aspirina, ibuprofeno ni otros AINE a menos que el mdico lo autorice.  Use ropa holgada. No use nada apretado alrededor de la cintura.  Levante (eleve) la cabecera de la cama aproximadamente 6pulgadas (15cm).  Evite inclinarse si al hacerlo empeoran los sntomas.  Concurra a todas las visitas de [address] se lo haya indicado el mdico. Esto es importante. Comunquese con un mdico si:  Aparecen nuevos sntomas.  Adelgaza y no sabe por qu.  Tiene problemas para tragar o le duele cuando traga.  Tiene sibilancias o tos persistente.  Los sntomas no mejoran con Scientist, research (medical).  Tiene la voz ronca. Solicite ayuda inmediatamente si:  [employer], el cuello, la [place], los dientes o la espalda.  Se siente transpirado, mareado o tiene una sensacin de desvanecimiento.  Siente falta de aire o Journalist, newspaper.  Vomita y el vmito tiene un aspecto similar a la sangre o a los posos de caf.  Pierde el conocimiento (se desmaya).  Las deposiciones (heces) son sanguinolentas o negras.  No puede tragar, beber o comer. Resumen  Si una persona tiene enfermedad de reflujo gastroesofgico (ERGE), los alimentos y el cido estomacal suben al esfago y  causan sntomas o problemas tales como dao en el esfago.  El tratamiento depender de la gravedad de los sntomas.  Siga una Air traffic controller se lo haya indicado el mdico.  Tome todos los medicamentos solamente como  se lo haya indicado el mdico. Esta informacin no tiene Theme park manager el consejo del mdico. Asegrese de hacerle al mdico cualquier pregunta que tenga. Document Released: 06/12/2010 Document Revised: 12/22/2017 Document Reviewed: 12/22/2017 Elsevier Interactive Patient Education  2019 [employer].  ) No appointment    Edwina Barth, MD [facility]

## 2018-08-08 NOTE — Patient Instructions (Addendum)
If you have lab work done today you will be contacted with your lab results within the next 2 weeks.  If you have not heard from [place] then please contact us. The fastest way to get your results is to register for My Chart.   IF you received an x-ray today, you will receive an invoice from [facility]. Please contact [facility]iology at [phone] with questions or concerns regarding your invoice.   IF you received labwork today, you will receive an invoice from [place]. Please contact [facility] at [phone] with questions or concerns regarding your invoice.   Our billing staff will not be able to assist you with questions regarding bills from these companies.  You will be contacted with the lab results as soon as they are available. The fastest way to get your results is to activate your My Chart account. Instructions are located on the last page of this paperwork. If you have not heard from [place] regarding the results in 2 weeks, please contact this office.     Enfermedad de reflujo gastroesofgico en los adultos Gastroesophageal Reflux Disease, Adult El reflujo gastroesofgico (RGE) ocurre cuando el cido del estmago sube por el tubo que conecta la boca con el estmago (esfago). Normalmente, la comida baja por el esfago y se mantiene en el estmago, donde se la digiere. Cuando una persona tiene RGE, los alimentos y el cido estomacal suelen volver al esfago. Usted puede tener una enfermedad llamada enfermedad de reflujo gastroesofgico (ERGE) si el reflujo:  Sucede a menudo.  Causa sntomas frecuentes o muy intensos.  Causa problemas tales como dao en el esfago. Cuando esto ocurre, el esfago duele y se hincha (inflama). Con el tiempo, la ERGE puede ocasionar pequeos agujeros (lceras) en el revestimiento del esfago. Cules son las causas? Esta afeccin se debe a un problema en el msculo que se encuentra entre el esfago y [place]. Cuando este msculo est  dbil o no es normal, no se cierra correctamente para impedir que los alimentos y el cido regresen del Paramedic. El msculo puede debilitarse debido a lo siguiente:  El consumo de [place].  [place].  Tener cierto tipo de hernia (hernia de hiato).  Consumo de alcohol.  Ciertos alimentos y bebidas, como caf, chocolate, cebollas y [place]. Qu incrementa el riesgo? Es ms probable que tenga esta afeccin si:  Tiene sobrepeso.  Tiene una enfermedad que afecta el tejido conjuntivo.  [place] antiinflamatorios no esteroideos (AINE). Cules son los signos o los sntomas? Los sntomas de esta afeccin incluyen:  Acidez estomacal.  Dificultad o dolor al tragar.  Sensacin de Best boy un bulto en la garganta.  Sabor amargo en la boca.  Mal aliento.  Tener una gran cantidad de saliva.  Estmago inflamado o con Tree surgeon.  Eructos.  Dolor en el pecho. El dolor de pecho puede deberse a distintas afecciones. Asegrese de Teacher, adult education a su mdico si tiene Tourist information centre manager.  Falta de aire o respiracin ruidosa (sibilancias).  Tos constante (crnica) o durante la noche.  Desgaste de la superficie de los dientes (esmalte dental).  Prdida de peso. Cmo se trata? El tratamiento depender de la gravedad de los sntomas. El mdico puede sugerirle lo siguiente:  Cambios en la dieta.  Medicamentos.  [place]. Siga estas indicaciones en su casa: Comida y bebida   Siga una dieta como se lo haya indicado el mdico. Es posible que deba evitar alimentos y bebidas, por ejemplo: ? Caf y t (con o sin  cafena). ? Bebidas que contengan alcohol. ? Bebidas energticas y deportivas. ? Bebidas gaseosas y refrescos. ? Chocolate y cacao. ? Menta y esencia de [place]. ? Ajo y cebolla. ? Rbano picante. ? Alimentos cidos y condimentados. Estos incluyen todos los tipos de pimientos, [place] en polvo, curry en polvo, vinagre, salsas picantes y Occidental Petroleum. ? Ctricos y sus jugos, por ejemplo,  naranjas, limones y limas. ? Alimentos que [employer]. Estos incluyen salsa roja, [place], salsa picante y pizza con salsa de [place]. ? Alimentos fritos y Lexicographer. Estos incluyen donas, papas fritas, papitas fritas de bolsa y aderezos con alto contenido de [place] ? Carnes con alto contenido de [place]). Estas incluye los perros calientes, chuletas o costillas, embutidos, jamn y tocino. ? Productos lcteos ricos en grasas, como leche [place], [place] y [place] crema.  Consuma pequeas cantidades de comida con ms frecuencia. Evite consumir porciones abundantes.  Evite beber grandes cantidades de lquidos con las comidas.  Evite comer 2 o 3horas antes de acostarse.  Evite recostarse inmediatamente despus de comer.  No haga ejercicios enseguida despus de comer. Estilo de vida   No consuma ningn producto que contenga nicotina o tabaco. Estos incluyen cigarrillos, cigarrillos electrnicos y tabaco para Theatre manager. Si necesita ayuda para dejar de fumar, consulte al [employer].  Intente reducir [employer] Penney de estrs. Si necesita ayuda para hacer esto, consulte al mdico.  Si tiene sobrepeso, baje una cantidad de peso saludable para usted. Consulte a su mdico para bajar de peso de [employer]. Indicaciones generales  Est atento a cualquier cambio en los sntomas.  Tome los medicamentos de venta libre y los recetados solamente como se lo haya indicado el mdico. No tome aspirina, ibuprofeno ni otros AINE a menos que el mdico lo autorice.  Use ropa holgada. No use nada apretado alrededor de la cintura.  Levante (eleve) la cabecera de la cama aproximadamente 6pulgadas (15cm).  Evite inclinarse si al hacerlo empeoran los sntomas.  Concurra a todas las visitas de [address] se lo haya indicado el mdico. Esto es importante. Comunquese con un mdico si:  Aparecen nuevos sntomas.  Adelgaza y no sabe por qu.  Tiene problemas para tragar o le duele cuando traga.  Tiene sibilancias  o tos persistente.  Los sntomas no mejoran con Scientist, research (medical).  Tiene la voz ronca. Solicite ayuda inmediatamente si:  Goldman Sachs, el cuello, la [place], los dientes o la espalda.  Se siente transpirado, mareado o tiene una sensacin de desvanecimiento.  Siente falta de aire o Journalist, newspaper.  Vomita y el vmito tiene un aspecto similar a la sangre o a los posos de caf.  Pierde el conocimiento (se desmaya).  Las deposiciones (heces) son sanguinolentas o negras.  No puede tragar, beber o comer. Resumen  Si una persona tiene enfermedad de reflujo gastroesofgico (ERGE), los alimentos y el cido estomacal suben al esfago y causan sntomas o problemas tales como dao en el esfago.  El tratamiento depender de la gravedad de los sntomas.  Siga una Air traffic controller se lo haya indicado el mdico.  Tome todos los medicamentos solamente como se lo haya indicado el mdico. Esta informacin no tiene Theme park manager el consejo del mdico. Asegrese de hacerle al mdico cualquier pregunta que tenga. Document Released: 06/12/2010 Document Revised: 12/22/2017 Document Reviewed: 12/22/2017 Elsevier Interactive Patient Education  2019 [employer].
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
## 2022-06-23 ENCOUNTER — Inpatient Hospital Stay (HOSPITAL_COMMUNITY)
Admission: EM | Admit: 2022-06-23 | Discharge: 2022-06-23 | Disposition: A | Discharge status: Home / Self Care | Payer: BC Managed Care – PPO | Attending: Emergency Medicine | Admitting: Emergency Medicine
